# Patient Record
Sex: Male | Born: 1965
Health system: Southern US, Community
[De-identification: ages and names within clinical notes are randomized; demographics above are authoritative.]

## PROBLEM LIST (undated history)

## (undated) DIAGNOSIS — E785 Hyperlipidemia, unspecified: Secondary | ICD-10-CM

## (undated) DIAGNOSIS — E669 Obesity, unspecified: Secondary | ICD-10-CM

## (undated) DIAGNOSIS — E119 Type 2 diabetes mellitus without complications: Secondary | ICD-10-CM

## (undated) DIAGNOSIS — I10 Essential (primary) hypertension: Secondary | ICD-10-CM

## (undated) DIAGNOSIS — E559 Vitamin D deficiency, unspecified: Secondary | ICD-10-CM

## (undated) HISTORY — DX: Vitamin D deficiency, unspecified: E55.9

## (undated) HISTORY — DX: Hyperlipidemia, unspecified: E78.5

## (undated) HISTORY — DX: Essential (primary) hypertension: I10

## (undated) HISTORY — DX: Obesity, unspecified: E66.9

## (undated) HISTORY — DX: Type 2 diabetes mellitus without complications: E11.9

---
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
## 2013-01-12 ENCOUNTER — Ambulatory Visit (INDEPENDENT_AMBULATORY_CARE_PROVIDER_SITE_OTHER): Payer: 59 | Admitting: Cardiovascular Disease

## 2013-01-12 ENCOUNTER — Encounter: Payer: Self-pay | Admitting: *Deleted

## 2013-01-12 ENCOUNTER — Encounter: Payer: Self-pay | Admitting: Cardiovascular Disease

## 2013-01-12 VITALS — BP 140/98 | HR 88 | Ht 69.0 in | Wt 269.0 lb

## 2013-01-12 DIAGNOSIS — E119 Type 2 diabetes mellitus without complications: Secondary | ICD-10-CM

## 2013-01-12 DIAGNOSIS — I10 Essential (primary) hypertension: Secondary | ICD-10-CM

## 2013-01-12 DIAGNOSIS — E669 Obesity, unspecified: Secondary | ICD-10-CM | POA: Insufficient documentation

## 2013-01-12 DIAGNOSIS — E1165 Type 2 diabetes mellitus with hyperglycemia: Secondary | ICD-10-CM | POA: Insufficient documentation

## 2013-01-12 DIAGNOSIS — R011 Cardiac murmur, unspecified: Secondary | ICD-10-CM | POA: Insufficient documentation

## 2013-01-12 DIAGNOSIS — E559 Vitamin D deficiency, unspecified: Secondary | ICD-10-CM | POA: Insufficient documentation

## 2013-01-12 DIAGNOSIS — E782 Mixed hyperlipidemia: Secondary | ICD-10-CM | POA: Insufficient documentation

## 2013-01-12 DIAGNOSIS — R9431 Abnormal electrocardiogram [ECG] [EKG]: Secondary | ICD-10-CM | POA: Insufficient documentation

## 2013-01-12 DIAGNOSIS — E785 Hyperlipidemia, unspecified: Secondary | ICD-10-CM | POA: Insufficient documentation

## 2013-01-12 DIAGNOSIS — E1169 Type 2 diabetes mellitus with other specified complication: Secondary | ICD-10-CM | POA: Insufficient documentation

## 2013-01-12 MED ORDER — BENAZEPRIL HCL 40 MG PO TABS: 40.0000 mg | ORAL_TABLET | Freq: Every day | ORAL | Status: DC

## 2013-01-12 NOTE — Assessment & Plan Note (Signed)
Discussed low carb diet.  Target hemoglobin A1c is 6.5 or less.  Continue current medications.  

## 2013-01-12 NOTE — Assessment & Plan Note (Signed)
Cholesterol is at goal.  Continue current dose of statin and diet Rx.  No myalgias or side effects.  F/U  LFT's in 6 months. No results found for this basename: LDLCALC   LDL [address] [place]

## 2013-01-12 NOTE — Assessment & Plan Note (Signed)
He has a benign 1/6 SEM Doubt AS  F/U echo

## 2013-01-12 NOTE — Patient Instructions (Signed)
Your physician recommends that you schedule a follow-up appointment in: AS NEEDED  Your physician has requested that you have an echocardiogram. Echocardiography is a painless test that uses sound waves to create images of your heart. It provides your doctor with information about the size and shape of your heart and how well your heart's chambers and valves are working. This procedure takes approximately one hour. There are no restrictions for this procedure.  DX MURMUR  Your physician has recommended you make the following change in your medication: INCREASE BENAZEPRIL TO 40 MG

## 2013-01-12 NOTE — Assessment & Plan Note (Signed)
Increase ACE may need diuretic in future.  Discussed exercise weight loss and low sodium diet  F/U primary

## 2013-01-12 NOTE — Assessment & Plan Note (Signed)
Likely related to HTN  F/U echo to assess

## 2013-01-12 NOTE — Progress Notes (Signed)
Patient ID: James Murphy, male   DOB: [birth year], 47 y.o.   MRN: [id number] 47 yo self referred.  History of HTN on Rx over a year Not happy with doctors in [place] at [facility].  He is overweight and has too much salt No chest pain dyspnea or palpitations.  Compliant with meds.  Says he has history of aortic stenosis with murmur since age 86.  Denies excess ETOH or drugs.  No history of CAD.  Wife works at [employer].  Has noninsulin dependant DM    ROS: Denies fever, malais, weight loss, blurry vision, decreased visual acuity, cough, sputum, SOB, hemoptysis, pleuritic pain, palpitaitons, heartburn, abdominal pain, melena, lower extremity edema, claudication, or rash.  All other systems reviewed and negative   General: Affect appropriate Obese black male HEENT: normal Neck supple with no adenopathy JVP normal no bruits no thyromegaly Lungs clear with no wheezing and good diaphragmatic motion Heart:  S1/S2 no murmur,rub, gallop or click PMI normal Abdomen: benighn, BS positve, no tenderness, no AAA no bruit.  No HSM or HJR Distal pulses intact with no bruits No edema Neuro non-focal Skin warm and dry No muscular weakness  Medications Current Outpatient Prescriptions  Medication Sig Dispense Refill  . amLODipine (NORVASC) 5 MG tablet Take 5 mg by mouth daily.      . benazepril (LOTENSIN) 20 MG tablet Take 20 mg by mouth daily.      . metFORMIN (GLUCOPHAGE) 500 MG tablet Take 500 mg by mouth daily with breakfast.       . pravastatin (PRAVACHOL) 40 MG tablet Take 40 mg by mouth daily.      . Vitamin D, Ergocalciferol, (DRISDOL) 50000 UNITS CAPS Take 50,000 Units by mouth every 7 (seven) days.        Allergies Review of patient's allergies indicates no known allergies.  Family History: Family History  Problem Relation Age of Onset  . Diabetes    . Hypertension    . Heart disease      Social History: History   Social History  . Marital Status: Married    Spouse  Name: N/A    Number of Children: N/A  . Years of Education: N/A   Occupational History  . Not on file.   Social History Main Topics  . Smoking status: Never Smoker   . Smokeless tobacco: Not on file  . Alcohol Use: No  . Drug Use: No  . Sexually Active: Not on file   Other Topics Concern  . Not on file   Social History Narrative  . No narrative on file    Electrocardiogram:  SR rate 88 LVH with strain  Assessment and Plan

## 2013-01-25 ENCOUNTER — Ambulatory Visit (HOSPITAL_COMMUNITY): Payer: 59 | Attending: Cardiovascular Disease

## 2013-01-25 DIAGNOSIS — R011 Cardiac murmur, unspecified: Secondary | ICD-10-CM | POA: Insufficient documentation

## 2013-01-25 DIAGNOSIS — E119 Type 2 diabetes mellitus without complications: Secondary | ICD-10-CM | POA: Insufficient documentation

## 2013-01-25 DIAGNOSIS — I10 Essential (primary) hypertension: Secondary | ICD-10-CM | POA: Insufficient documentation

## 2013-01-25 DIAGNOSIS — R9431 Abnormal electrocardiogram [ECG] [EKG]: Secondary | ICD-10-CM | POA: Insufficient documentation

## 2013-01-25 NOTE — Progress Notes (Signed)
Echocardiogram performed.  

## 2013-01-28 ENCOUNTER — Ambulatory Visit (INDEPENDENT_AMBULATORY_CARE_PROVIDER_SITE_OTHER): Payer: 59 | Admitting: Family Medicine

## 2013-01-28 ENCOUNTER — Encounter: Payer: Self-pay | Admitting: Family Medicine

## 2013-01-28 VITALS — BP 140/100 | HR 84 | Resp 16 | Ht 69.0 in | Wt 268.8 lb

## 2013-01-28 DIAGNOSIS — E119 Type 2 diabetes mellitus without complications: Secondary | ICD-10-CM

## 2013-01-28 DIAGNOSIS — E559 Vitamin D deficiency, unspecified: Secondary | ICD-10-CM

## 2013-01-28 DIAGNOSIS — E669 Obesity, unspecified: Secondary | ICD-10-CM

## 2013-01-28 DIAGNOSIS — E785 Hyperlipidemia, unspecified: Secondary | ICD-10-CM

## 2013-01-28 DIAGNOSIS — R011 Cardiac murmur, unspecified: Secondary | ICD-10-CM

## 2013-01-28 DIAGNOSIS — I10 Essential (primary) hypertension: Secondary | ICD-10-CM

## 2013-01-28 LAB — CBC
Platelets: 376 10*3/uL (ref 150–400)
RDW: 14.7 % (ref 11.5–15.5)
WBC: 8.6 10*3/uL (ref 4.0–10.5)

## 2013-01-28 LAB — COMPREHENSIVE METABOLIC PANEL
ALT: 30 U/L (ref 0–53)
Albumin: 4.5 g/dL (ref 3.5–5.2)
CO2: 29 mEq/L (ref 19–32)
Calcium: 10 mg/dL (ref 8.4–10.5)
Chloride: 105 mEq/L (ref 96–112)
Creat: 1.13 mg/dL (ref 0.50–1.35)
Total Protein: 7.7 g/dL (ref 6.0–8.3)

## 2013-01-28 LAB — LIPID PANEL
Cholesterol: 111 mg/dL (ref 0–200)
HDL: 33 mg/dL — ABNORMAL LOW (ref >39)
Triglycerides: 72 mg/dL (ref <150)

## 2013-01-28 MED ORDER — AMLODIPINE BESYLATE 10 MG PO TABS: 10.0000 mg | ORAL_TABLET | Freq: Every day | ORAL | Status: DC

## 2013-01-28 NOTE — Assessment & Plan Note (Signed)
Recheck vitamin D level he has been on replacement for quite some time now

## 2013-01-28 NOTE — Assessment & Plan Note (Signed)
[place] LDL less than 100 continue pravastatin repeat fasting lipid panel today

## 2013-01-28 NOTE — Assessment & Plan Note (Signed)
Blood pressure uncontrolled however he has not taken his medication he had a recent increase in his benazepril

## 2013-01-28 NOTE — Assessment & Plan Note (Signed)
Very soft murmur heard review cardiology note he had echocardiogram which was fairly normal with exception of LVH

## 2013-01-28 NOTE — Assessment & Plan Note (Addendum)
His previous A1c looks good at 6.2% will continue metformin at current dose repeat labs today He is to schedule eye appointment

## 2013-01-28 NOTE — Assessment & Plan Note (Signed)
Discussed importance of diet exercise
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
## 2013-01-28 NOTE — Patient Instructions (Addendum)
Continue current meds Get the fasting labs we will call with results Increase activity , start exercising and watch the fried foods, soda, fruit juice I will get records  F/U 3 months

## 2013-01-29 LAB — HEMOGLOBIN A1C
Hgb A1c MFr Bld: 6.1 % — ABNORMAL HIGH (ref <5.7)
Mean Plasma Glucose: 128 mg/dL — ABNORMAL HIGH (ref <117)

## 2013-04-30 ENCOUNTER — Ambulatory Visit: Payer: 59 | Admitting: Family Medicine

## 2013-05-10 ENCOUNTER — Telehealth: Payer: Self-pay | Admitting: Family Medicine

## 2013-05-10 ENCOUNTER — Other Ambulatory Visit: Payer: Self-pay

## 2013-05-10 MED ORDER — METFORMIN HCL 500 MG PO TABS: 500.0000 mg | ORAL_TABLET | Freq: Every day | ORAL | Status: DC

## 2013-05-10 NOTE — Telephone Encounter (Signed)
Med refilled.  Not noted that patient has made request for med previously.

## 2013-05-18 ENCOUNTER — Ambulatory Visit (INDEPENDENT_AMBULATORY_CARE_PROVIDER_SITE_OTHER): Payer: 59 | Admitting: Family Medicine

## 2013-05-18 ENCOUNTER — Encounter: Payer: Self-pay | Admitting: Family Medicine

## 2013-05-18 VITALS — BP 132/90 | HR 97 | Resp 16 | Wt 265.4 lb

## 2013-05-18 DIAGNOSIS — E559 Vitamin D deficiency, unspecified: Secondary | ICD-10-CM

## 2013-05-18 DIAGNOSIS — E669 Obesity, unspecified: Secondary | ICD-10-CM

## 2013-05-18 DIAGNOSIS — E119 Type 2 diabetes mellitus without complications: Secondary | ICD-10-CM

## 2013-05-18 DIAGNOSIS — I10 Essential (primary) hypertension: Secondary | ICD-10-CM

## 2013-05-18 DIAGNOSIS — E785 Hyperlipidemia, unspecified: Secondary | ICD-10-CM

## 2013-05-18 LAB — BASIC METABOLIC PANEL
Calcium: 9.7 mg/dL (ref 8.4–10.5)
Creat: 1.19 mg/dL (ref 0.50–1.35)
Sodium: 140 mEq/L (ref 135–145)

## 2013-05-18 LAB — HEMOGLOBIN A1C
Hgb A1c MFr Bld: 6.3 % — ABNORMAL HIGH (ref <5.7)
Mean Plasma Glucose: 134 mg/dL — ABNORMAL HIGH (ref <117)

## 2013-05-18 MED ORDER — LEVOCETIRIZINE DIHYDROCHLORIDE 5 MG PO TABS: 5.0000 mg | ORAL_TABLET | Freq: Every evening | ORAL | Status: DC

## 2013-05-18 MED ORDER — AMLODIPINE BESYLATE 10 MG PO TABS: 10.0000 mg | ORAL_TABLET | Freq: Every day | ORAL | Status: DC

## 2013-05-18 MED ORDER — PRAVASTATIN SODIUM 40 MG PO TABS: 40.0000 mg | ORAL_TABLET | Freq: Every day | ORAL | Status: DC

## 2013-05-18 NOTE — Assessment & Plan Note (Signed)
BP improved today, still a little suboptimal for DM, no change to today, I think if he works on weight loss his will be able to decrease his BP meds at some point

## 2013-05-18 NOTE — Assessment & Plan Note (Signed)
Lost 3lbs ,increase aerobic activity

## 2013-05-18 NOTE — Assessment & Plan Note (Signed)
Urine Micro today Work on diet, exercise Recheck A1C,low dose metformin, we may be able to titrate off of this

## 2013-05-18 NOTE — Progress Notes (Signed)
  Subjective:    Patient ID: Conall Vangorder, male    DOB: 15-Nov-1966, 47 y.o.   MRN: [id number]  HPI  Pt here to f/u chronic medical problems,does not take fasting CBG, often forgets.No symptoms of hypoglycemia, seen by opthalmology has new glasses- [facility] Medications reviewed, has been out of his cholesterol medication past 2 months Allergies during spring, mostly runny nose,. Sneezing, has tried Claritin and zyrtec OTC, neither have helped, also using OTC eye drop which works well    Review of Systems   GEN- denies fatigue, fever, weight loss,weakness, recent illness HEENT- denies eye drainage, change in vision,+ nasal discharge, CVS- denies chest pain, palpitations RESP- denies SOB, cough, wheeze ABD- denies N/V, change in stools, abd pain GU- denies dysuria, hematuria, dribbling, incontinence MSK- denies joint pain, muscle aches, injury Neuro- denies headache, dizziness, syncope, seizure activity      Objective:   Physical Exam GEN- NAD, alert and oriented x3  Repeat BP 136/88 HEENT- PERRL, EOMI, non injected sclera, pink conjunctiva, MMM, oropharynx mild injection, TM clear bilat no effusion, no maxillary sinus tenderness, nares clear rhinorrhea Neck- Supple, no LAD CVS- RRR, soft 2/6 murmur RESP-CTAB EXT- No edema Pulses- Radial 2+, DP 2+           Assessment & Plan:

## 2013-05-18 NOTE — Patient Instructions (Addendum)
Urine sample today to check for protein Continue current medications We will call with lab results  Increase exercise 4 days a week Add multivitamin for Men ( Check make sure it has Vit D 800-1000IU) Restart the cholesterol medication  New allergy medication / you can use nasal saline F/U 4 months [employer]- for physical

## 2013-05-18 NOTE — Assessment & Plan Note (Signed)
Restart statin drug  

## 2013-05-18 NOTE — Assessment & Plan Note (Signed)
I will have him start MVI with at least 800IU vitamin D

## 2013-05-19 LAB — MICROALBUMIN / CREATININE URINE RATIO
Microalb Creat Ratio: 5.4 mg/g (ref 0.0–30.0)
Microalb, Ur: 1.87 mg/dL (ref 0.00–1.89)

## 2013-08-30 ENCOUNTER — Telehealth: Payer: Self-pay | Admitting: Family Medicine

## 2013-08-30 NOTE — Telephone Encounter (Signed)
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 They told him that his proteins were still high. Do you want to call him in anything? He uses [facility] (for some reason it will not let me pull that up in visit pharmacy)

## 2013-08-30 NOTE — Telephone Encounter (Signed)
Left message for pt to call back  °

## 2013-08-30 NOTE — Telephone Encounter (Signed)
Pt needs appt to have urine repeated There is not medicine for this he should continue his current blood pressure medications

## 2013-08-31 NOTE — Telephone Encounter (Signed)
Pt made an appt for Monday at 8:30.

## 2013-09-06 ENCOUNTER — Encounter: Payer: Self-pay | Admitting: Family Medicine

## 2013-09-06 ENCOUNTER — Ambulatory Visit (INDEPENDENT_AMBULATORY_CARE_PROVIDER_SITE_OTHER): Payer: 59 | Admitting: Family Medicine

## 2013-09-06 VITALS — BP 150/94 | HR 80 | Temp 97.8°F | Resp 18 | Wt 267.0 lb

## 2013-09-06 DIAGNOSIS — Z23 Encounter for immunization: Secondary | ICD-10-CM

## 2013-09-06 DIAGNOSIS — I10 Essential (primary) hypertension: Secondary | ICD-10-CM

## 2013-09-06 DIAGNOSIS — R809 Proteinuria, unspecified: Secondary | ICD-10-CM

## 2013-09-06 DIAGNOSIS — E785 Hyperlipidemia, unspecified: Secondary | ICD-10-CM

## 2013-09-06 DIAGNOSIS — E119 Type 2 diabetes mellitus without complications: Secondary | ICD-10-CM

## 2013-09-06 LAB — COMPLETE METABOLIC PANEL WITH GFR
ALT: 28 U/L (ref 0–53)
BUN: 15 mg/dL (ref 6–23)
CO2: 29 mEq/L (ref 19–32)
Calcium: 10.4 mg/dL (ref 8.4–10.5)
Creat: 1.19 mg/dL (ref 0.50–1.35)
GFR, Est African American: 85 mL/min
Total Bilirubin: 0.4 mg/dL (ref 0.3–1.2)

## 2013-09-06 LAB — URINALYSIS, ROUTINE W REFLEX MICROSCOPIC
Glucose, UA: NEGATIVE mg/dL
Hgb urine dipstick: NEGATIVE
Ketones, ur: NEGATIVE mg/dL
Leukocytes, UA: NEGATIVE
Nitrite: NEGATIVE
Protein, ur: NEGATIVE mg/dL

## 2013-09-06 LAB — CBC WITH DIFFERENTIAL/PLATELET
Eosinophils Absolute: 0.2 10*3/uL (ref 0.0–0.7)
Hemoglobin: 14.3 g/dL (ref 13.0–17.0)
Lymphocytes Relative: 64 % — ABNORMAL HIGH (ref 12–46)
Lymphs Abs: 4 10*3/uL (ref 0.7–4.0)
MCH: 28.9 pg (ref 26.0–34.0)
Monocytes Relative: 10 % (ref 3–12)
Neutro Abs: 1.4 10*3/uL — ABNORMAL LOW (ref 1.7–7.7)
Neutrophils Relative %: 21 % — ABNORMAL LOW (ref 43–77)
Platelets: 349 10*3/uL (ref 150–400)
RBC: 4.95 MIL/uL (ref 4.22–5.81)
WBC: 6.2 10*3/uL (ref 4.0–10.5)

## 2013-09-06 LAB — LIPID PANEL
LDL Cholesterol: 74 mg/dL (ref 0–99)
Total CHOL/HDL Ratio: 3.5 Ratio
VLDL: 14 mg/dL (ref 0–40)

## 2013-09-06 MED ORDER — AMLODIPINE BESYLATE 10 MG PO TABS: 10.0000 mg | ORAL_TABLET | Freq: Every day | ORAL | Status: DC

## 2013-09-06 MED ORDER — PRAVASTATIN SODIUM 40 MG PO TABS: 40.0000 mg | ORAL_TABLET | Freq: Every day | ORAL | Status: DC

## 2013-09-06 MED ORDER — BENAZEPRIL HCL 40 MG PO TABS: 40.0000 mg | ORAL_TABLET | Freq: Every day | ORAL | Status: DC

## 2013-09-06 MED ORDER — HYDROCHLOROTHIAZIDE 25 MG PO TABS: 25.0000 mg | ORAL_TABLET | Freq: Every day | ORAL | Status: DC

## 2013-09-06 MED ORDER — METFORMIN HCL 500 MG PO TABS: 500.0000 mg | ORAL_TABLET | Freq: Every day | ORAL | Status: DC

## 2013-09-06 NOTE — Progress Notes (Signed)
  Subjective:    Patient ID: James Murphy, male    DOB: Dec 19, 1966, 47 y.o.   MRN: [id number]  HPI  Pt here to f/u proteinuria. Had DOT exam, 30 of protein seen in UA, BP also elevated, so advised to come to PCP for evaluation. Taking meds as prescribed. Has not been watching his diet. BP at DOT exam was 145/95 Medications reviewed   Review of Systems  GEN- denies fatigue, fever, weight loss,weakness, recent illness HEENT- denies eye drainage, change in vision, nasal discharge, CVS- denies chest pain, palpitations RESP- denies SOB, cough, wheeze ABD- denies N/V, change in stools, abd pain GU- denies dysuria, hematuria, dribbling, incontinence MSK- denies joint pain, muscle aches, injury Neuro- denies headache, dizziness, syncope, seizure activity      Objective:   Physical Exam GEN- NAD, alert and oriented x3 HEENT- PERRL, EOMI, non injected sclera, pink conjunctiva, MMM, oropharynx clear Neck- Supple,  CVS- RRR, no murmur RESP-CTAB ABD-NABS,soft,NT,ND EXT- No edema Pulses- Radial, DP- 2+        Assessment & Plan:

## 2013-09-06 NOTE — Assessment & Plan Note (Signed)
Repeat UA, negative for protein, urine Micro 3 months ago, normal Will await renal function, may need renal ultrasound

## 2013-09-06 NOTE — Assessment & Plan Note (Signed)
Uncontrolled, add HCTZ to regimen Check metabolic panel

## 2013-09-06 NOTE — Assessment & Plan Note (Signed)
Check FLP, on statin drug 

## 2013-09-06 NOTE — Assessment & Plan Note (Signed)
Check A1C 

## 2013-09-06 NOTE — Patient Instructions (Signed)
New blood pressure medication HCTZ this is a water pill Continue all other medications We will call with lab results  F/U 2 weeks for blood pressure

## 2013-09-20 ENCOUNTER — Ambulatory Visit (INDEPENDENT_AMBULATORY_CARE_PROVIDER_SITE_OTHER): Payer: 59 | Admitting: Family Medicine

## 2013-09-20 ENCOUNTER — Encounter: Payer: Self-pay | Admitting: Family Medicine

## 2013-09-20 VITALS — BP 128/92 | HR 80 | Temp 98.0°F | Resp 20 | Wt 267.0 lb

## 2013-09-20 DIAGNOSIS — Z23 Encounter for immunization: Secondary | ICD-10-CM

## 2013-09-20 DIAGNOSIS — I10 Essential (primary) hypertension: Secondary | ICD-10-CM

## 2013-09-20 NOTE — Patient Instructions (Signed)
Continue current medication F/U 4 months  

## 2013-09-20 NOTE — Progress Notes (Signed)
  Subjective:    Patient ID: James Murphy, male    DOB: September 12, 1966, 47 y.o.   MRN: [id number]  HPI  Pt here to f/u HTN, last visit 2 weeks ago started on HCTZ. 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 Labs reviewed WNL, UA neg for protein.   He does state that he snores a lot, denies stopping breathing/apnea. Has tried OTC meds. Did not want further evaluation  Review of Systems  GEN- denies fatigue, fever, weight loss,weakness, recent illness HEENT- denies eye drainage, change in vision, nasal discharge, CVS- denies chest pain, palpitations RESP- denies SOB, cough, wheeze Neuro- denies headache, dizziness, syncope, seizure activity      Objective:   Physical Exam  GEN- NAD, alert and oriented x3 HEENT- PERRL, EOMI, non injected sclera, pink conjunctiva, MMM, oropharynx clear Neck- circumference 19cm CVS- RRR, no murmur RESP-CTAB EXT- No edema Pulses- Radial 2+       Assessment & Plan:

## 2013-09-20 NOTE — Assessment & Plan Note (Signed)
BP improved, continue current meds Labs reviewed, UA was neg for protein

## 2013-12-10 ENCOUNTER — Other Ambulatory Visit: Payer: Self-pay | Admitting: Family Medicine

## 2014-01-17 ENCOUNTER — Emergency Department (INDEPENDENT_AMBULATORY_CARE_PROVIDER_SITE_OTHER): Payer: 59

## 2014-01-17 ENCOUNTER — Encounter (HOSPITAL_COMMUNITY): Payer: Self-pay | Admitting: Emergency Medicine

## 2014-01-17 ENCOUNTER — Emergency Department (HOSPITAL_COMMUNITY): Payer: 59

## 2014-01-17 ENCOUNTER — Emergency Department (HOSPITAL_COMMUNITY)
Admission: EM | Admit: 2014-01-17 | Discharge: 2014-01-17 | Disposition: A | Discharge status: Home / Self Care | Payer: 59 | Source: Home / Self Care | Attending: Emergency Medicine | Admitting: Emergency Medicine

## 2014-01-17 DIAGNOSIS — J069 Acute upper respiratory infection, unspecified: Secondary | ICD-10-CM

## 2014-01-17 DIAGNOSIS — B9789 Other viral agents as the cause of diseases classified elsewhere: Principal | ICD-10-CM

## 2014-01-17 MED ORDER — FLUTICASONE PROPIONATE 50 MCG/ACT NA SUSP: 2.0000 | Freq: Two times a day (BID) | NASAL | Status: DC

## 2014-01-17 MED ORDER — AZITHROMYCIN 250 MG PO TABS: ORAL_TABLET | ORAL | Status: DC

## 2014-01-17 MED ORDER — AEROCHAMBER PLUS FLO-VU LARGE MISC: 1.0000 | Freq: Once | Status: DC

## 2014-01-17 MED ORDER — IPRATROPIUM-ALBUTEROL 0.5-2.5 (3) MG/3ML IN SOLN
RESPIRATORY_TRACT | Status: AC
  Filled 2014-01-17: qty 3

## 2014-01-17 MED ORDER — METHYLPREDNISOLONE 4 MG PO KIT: PACK | ORAL | Status: DC

## 2014-01-17 MED ORDER — ALBUTEROL SULFATE HFA 108 (90 BASE) MCG/ACT IN AERS: 1.0000 | INHALATION_SPRAY | Freq: Four times a day (QID) | RESPIRATORY_TRACT | Status: DC | PRN

## 2014-01-17 MED ORDER — IPRATROPIUM-ALBUTEROL 0.5-2.5 (3) MG/3ML IN SOLN
3.0000 mL | Freq: Once | RESPIRATORY_TRACT | Status: AC
  Administered 2014-01-17: 3 mL via RESPIRATORY_TRACT

## 2014-01-17 NOTE — ED Notes (Signed)
C/o   Generalized body aches.  Chills.  Sinus pressure and pain.  Nonproductive cough.  symptoms present x 4 days.   Denies n/v/d.   No relief with otc meds.

## 2014-01-17 NOTE — ED Provider Notes (Signed)
Medical screening examination/treatment/procedure(s) were performed by non-physician practitioner and as supervising physician I was immediately available for consultation/collaboration.  Leslee Homeavid Deavon Podgorski, M.D.   Reuben Likesavid C Araina Butrick, MD 01/17/14 [phone]

## 2014-01-17 NOTE — Discharge Instructions (Signed)
Antibiotic Nonuse ° Your caregiver felt that the infection or problem was not one that would be helped with an antibiotic. °Infections may be caused by viruses or bacteria. Only a caregiver can tell which one of these is the likely cause of an illness. A cold is the most common cause of infection in both adults and children. A cold is a virus. Antibiotic treatment will have no effect on a viral infection. Viruses can lead to many lost days of work caring for sick children and many missed days of school. Children may catch as many as 10 "colds" or "flus" per year during which they can be tearful, cranky, and uncomfortable. The goal of treating a virus is aimed at keeping the ill person comfortable. °Antibiotics are medications used to help the body fight bacterial infections. There are relatively few types of bacteria that cause infections but there are hundreds of viruses. While both viruses and bacteria cause infection they are very different types of germs. A viral infection will typically go away by itself within 7 to 10 days. Bacterial infections may spread or get worse without antibiotic treatment. °Examples of bacterial infections are: °· Sore throats (like strep throat or tonsillitis). °· Infection in the lung (pneumonia). °· Ear and skin infections. °Examples of viral infections are: °· Colds or flus. °· Most coughs and bronchitis. °· Sore throats not caused by Strep. °· Runny noses. °It is often best not to take an antibiotic when a viral infection is the cause of the problem. Antibiotics can kill off the helpful bacteria that we have inside our body and allow harmful bacteria to start growing. Antibiotics can cause side effects such as allergies, nausea, and diarrhea without helping to improve the symptoms of the viral infection. Additionally, repeated uses of antibiotics can cause bacteria inside of our body to become resistant. That resistance can be passed onto harmful bacterial. The next time you have  an infection it may be harder to treat if antibiotics are used when they are not needed. Not treating with antibiotics allows our own immune system to develop and take care of infections more efficiently. Also, antibiotics will work better for us when they are prescribed for bacterial infections. °Treatments for a child that is ill may include: °· Give extra fluids throughout the day to stay hydrated. °· Get plenty of rest. °· Only give your child over-the-counter or prescription medicines for pain, discomfort, or fever as directed by your caregiver. °· The use of a cool mist humidifier may help stuffy noses. °· Cold medications if suggested by your caregiver. °Your caregiver may decide to start you on an antibiotic if: °· The problem you were seen for today continues for a longer length of time than expected. °· You develop a secondary bacterial infection. °SEEK MEDICAL CARE IF: °· Fever lasts longer than 5 days. °· Symptoms continue to get worse after 5 to 7 days or become severe. °· Difficulty in breathing develops. °· Signs of dehydration develop (poor drinking, rare urinating, dark colored urine). °· Changes in behavior or worsening tiredness (listlessness or lethargy). °Document Released: 02/17/2002 Document Revised: 03/02/2012 Document Reviewed: 08/16/2009 °ExitCare® Patient Information ©2014 [employer]. ° °Upper Respiratory Infection, Adult °An upper respiratory infection (URI) is also sometimes known as the common cold. The upper respiratory tract includes the nose, sinuses, throat, trachea, and bronchi. Bronchi are the airways leading to the lungs. Most people improve within 1 week, but symptoms can last up to 2 weeks. A residual cough may   last even longer.  °CAUSES °Many different viruses can infect the tissues lining the upper respiratory tract. The tissues become irritated and inflamed and often become very moist. Mucus production is also common. A cold is contagious. You can easily spread the virus  to others by oral contact. This includes kissing, sharing a glass, coughing, or sneezing. Touching your mouth or nose and then touching a surface, which is then touched by another person, can also spread the virus. °SYMPTOMS  °Symptoms typically develop 1 to 3 days after you come in contact with a cold virus. Symptoms vary from person to person. They may include: °· Runny nose. °· Sneezing. °· Nasal congestion. °· Sinus irritation. °· Sore throat. °· Loss of voice (laryngitis). °· Cough. °· Fatigue. °· Muscle aches. °· Loss of appetite. °· Headache. °· Low-grade fever. °DIAGNOSIS  °You might diagnose your own cold based on familiar symptoms, since most people get a cold 2 to 3 times a year. Your caregiver can confirm this based on your exam. Most importantly, your caregiver can check that your symptoms are not due to another disease such as strep throat, sinusitis, pneumonia, asthma, or epiglottitis. Blood tests, throat tests, and X-rays are not necessary to diagnose a common cold, but they may sometimes be helpful in excluding other more serious diseases. Your caregiver will decide if any further tests are required. °RISKS AND COMPLICATIONS  °You may be at risk for a more severe case of the common cold if you smoke cigarettes, have chronic heart disease (such as heart failure) or lung disease (such as asthma), or if you have a weakened immune system. The very young and very old are also at risk for more serious infections. Bacterial sinusitis, middle ear infections, and bacterial pneumonia can complicate the common cold. The common cold can worsen asthma and chronic obstructive pulmonary disease (COPD). Sometimes, these complications can require emergency medical care and may be life-threatening. °PREVENTION  °The best way to protect against getting a cold is to practice good hygiene. Avoid oral or hand contact with people with cold symptoms. Wash your hands often if contact occurs. There is no clear evidence that  vitamin C, vitamin E, echinacea, or exercise reduces the chance of developing a cold. However, it is always recommended to get plenty of rest and practice good nutrition. °TREATMENT  °Treatment is directed at relieving symptoms. There is no cure. Antibiotics are not effective, because the infection is caused by a virus, not by bacteria. Treatment may include: °· Increased fluid intake. Sports drinks offer valuable electrolytes, sugars, and fluids. °· Breathing heated mist or steam (vaporizer or shower). °· Eating chicken soup or other clear broths, and maintaining good nutrition. °· Getting plenty of rest. °· Using gargles or lozenges for comfort. °· Controlling fevers with ibuprofen or acetaminophen as directed by your caregiver. °· Increasing usage of your inhaler if you have asthma. °Zinc gel and zinc lozenges, taken in the first 24 hours of the common cold, can shorten the duration and lessen the severity of symptoms. Pain medicines may help with fever, muscle aches, and throat pain. A variety of non-prescription medicines are available to treat congestion and runny nose. Your caregiver can make recommendations and may suggest nasal or lung inhalers for other symptoms.  °HOME CARE INSTRUCTIONS  °· Only take over-the-counter or prescription medicines for pain, discomfort, or fever as directed by your caregiver. °· Use a warm mist humidifier or inhale steam from a shower to increase air moisture. This may keep   secretions moist and make it easier to breathe. °· Drink enough water and fluids to keep your urine clear or pale yellow. °· Rest as needed. °· Return to work when your temperature has returned to normal or as your caregiver advises. You may need to stay home longer to avoid infecting others. You can also use a face mask and careful hand washing to prevent spread of the virus. °SEEK MEDICAL CARE IF:  °· After the first few days, you feel you are getting worse rather than better. °· You need your caregiver's  advice about medicines to control symptoms. °· You develop chills, worsening shortness of breath, or brown or red sputum. These may be signs of pneumonia. °· You develop yellow or brown nasal discharge or pain in the face, especially when you bend forward. These may be signs of sinusitis. °· You develop a fever, swollen neck glands, pain with swallowing, or white areas in the back of your throat. These may be signs of strep throat. °SEEK IMMEDIATE MEDICAL CARE IF:  °· You have a fever. °· You develop severe or persistent headache, ear pain, sinus pain, or chest pain. °· You develop wheezing, a prolonged cough, cough up blood, or have a change in your usual mucus (if you have chronic lung disease). °· You develop sore muscles or a stiff neck. °Document Released: 06/04/2001 Document Revised: 03/02/2012 Document Reviewed: 04/12/2011 °ExitCare® Patient Information ©2014 [employer]. ° °

## 2014-01-17 NOTE — ED Notes (Signed)
Waiting for patient to finish breathing treatment. Will do x-ray after treatment.
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
## 2014-01-21 ENCOUNTER — Ambulatory Visit: Payer: 59 | Admitting: Family Medicine

## 2014-04-14 ENCOUNTER — Other Ambulatory Visit: Payer: Self-pay | Admitting: Family Medicine

## 2014-04-14 NOTE — Telephone Encounter (Signed)
Medication filled x1 with no refills.   Requires office visit before any further refills can be given.  

## 2014-04-15 ENCOUNTER — Encounter: Payer: Self-pay | Admitting: Family Medicine

## 2014-04-15 ENCOUNTER — Ambulatory Visit (INDEPENDENT_AMBULATORY_CARE_PROVIDER_SITE_OTHER): Payer: 59 | Admitting: Family Medicine

## 2014-04-15 VITALS — BP 142/84 | HR 86 | Temp 98.5°F | Resp 16 | Ht 69.0 in | Wt 250.0 lb

## 2014-04-15 DIAGNOSIS — E119 Type 2 diabetes mellitus without complications: Secondary | ICD-10-CM

## 2014-04-15 DIAGNOSIS — E669 Obesity, unspecified: Secondary | ICD-10-CM

## 2014-04-15 DIAGNOSIS — I10 Essential (primary) hypertension: Secondary | ICD-10-CM

## 2014-04-15 DIAGNOSIS — Z23 Encounter for immunization: Secondary | ICD-10-CM

## 2014-04-15 DIAGNOSIS — E785 Hyperlipidemia, unspecified: Secondary | ICD-10-CM

## 2014-04-15 LAB — LIPID PANEL
Cholesterol: 137 mg/dL (ref 0–200)
HDL: 45 mg/dL (ref >39)
LDL CALC: 84 mg/dL (ref 0–99)
TRIGLYCERIDES: 39 mg/dL (ref <150)
Total CHOL/HDL Ratio: 3 Ratio
VLDL: 8 mg/dL (ref 0–40)

## 2014-04-15 LAB — CBC WITH DIFFERENTIAL/PLATELET
Basophils Absolute: 0.1 10*3/uL (ref 0.0–0.1)
Basophils Relative: 1 % (ref 0–1)
Eosinophils Absolute: 0.2 10*3/uL (ref 0.0–0.7)
Eosinophils Relative: 3 % (ref 0–5)
HEMATOCRIT: 41.8 % (ref 39.0–52.0)
HEMOGLOBIN: 14.6 g/dL (ref 13.0–17.0)
Lymphocytes Relative: 58 % — ABNORMAL HIGH (ref 12–46)
Lymphs Abs: 3.9 10*3/uL (ref 0.7–4.0)
MCH: 29.3 pg (ref 26.0–34.0)
MCHC: 34.9 g/dL (ref 30.0–36.0)
MCV: 83.8 fL (ref 78.0–100.0)
MONO ABS: 0.6 10*3/uL (ref 0.1–1.0)
MONOS PCT: 9 % (ref 3–12)
NEUTROS ABS: 1.9 10*3/uL (ref 1.7–7.7)
Neutrophils Relative %: 29 % — ABNORMAL LOW (ref 43–77)
Platelets: 374 10*3/uL (ref 150–400)
RBC: 4.99 MIL/uL (ref 4.22–5.81)
RDW: 14.3 % (ref 11.5–15.5)
WBC: 6.7 10*3/uL (ref 4.0–10.5)

## 2014-04-15 LAB — COMPREHENSIVE METABOLIC PANEL
ALT: 16 U/L (ref 0–53)
AST: 15 U/L (ref 0–37)
Albumin: 4.3 g/dL (ref 3.5–5.2)
Alkaline Phosphatase: 41 U/L (ref 39–117)
BUN: 21 mg/dL (ref 6–23)
CALCIUM: 9.7 mg/dL (ref 8.4–10.5)
CHLORIDE: 101 meq/L (ref 96–112)
CO2: 28 mEq/L (ref 19–32)
Creat: 1.28 mg/dL (ref 0.50–1.35)
Glucose, Bld: 93 mg/dL (ref 70–99)
Potassium: 4.1 mEq/L (ref 3.5–5.3)
SODIUM: 138 meq/L (ref 135–145)
TOTAL PROTEIN: 7.7 g/dL (ref 6.0–8.3)
Total Bilirubin: 0.5 mg/dL (ref 0.2–1.2)

## 2014-04-15 LAB — HEMOGLOBIN A1C, FINGERSTICK: Hgb A1C (fingerstick): 5.9 % — ABNORMAL HIGH (ref <5.7)

## 2014-04-15 MED ORDER — PRAVASTATIN SODIUM 40 MG PO TABS: ORAL_TABLET | ORAL | Status: DC

## 2014-04-15 MED ORDER — BENAZEPRIL HCL 40 MG PO TABS: 40.0000 mg | ORAL_TABLET | Freq: Every day | ORAL | Status: DC

## 2014-04-15 MED ORDER — AMLODIPINE BESYLATE 10 MG PO TABS: 10.0000 mg | ORAL_TABLET | Freq: Every day | ORAL | Status: DC

## 2014-04-15 MED ORDER — HYDROCHLOROTHIAZIDE 25 MG PO TABS: 25.0000 mg | ORAL_TABLET | Freq: Every day | ORAL | Status: DC

## 2014-04-15 NOTE — Progress Notes (Signed)
Patient ID: James Murphy, male   DOB: Dec 20, 1966, 48 y.o.   MRN: [id number]   Subjective:    Patient ID: James Murphy, male    DOB: Dec 20, 1966, 48 y.o.   MRN: [id number]  Patient presents for Medication refills  here follow chronic medical problems. He missed his last visit for his diabetes and hypertension. He's been out of his medications for the past 3 days. He has lost 17 pounds since her last visit intentionally he currently works 2 jobs and his second is very physical. He has no specific concerns today. He was seen by orthopedics since her last visit secondary to left knee pain and had a steroid injection this needs he also had right ankle pain and they gave him a brace and what sounds like some anti-inflammatories. He is followup with him in 3 weeks. He is due for tetanus booster. He has not been checking his blood sugars therefore I have no readings.    Review Of Systems:  GEN- denies fatigue, fever, weight loss,weakness, recent illness HEENT- denies eye drainage, change in vision, nasal discharge, CVS- denies chest pain, palpitations RESP- denies SOB, cough, wheeze ABD- denies N/V, change in stools, abd pain GU- denies dysuria, hematuria, dribbling, incontinence MSK- + joint pain, muscle aches, injury Neuro- denies headache, dizziness, syncope, seizure activity       Objective:    BP 142/84  Pulse 86  Temp(Src) 98.5 F (36.9 C) (Oral)  Resp 16  Ht 5\' 9"  (1.753 m)  Wt 250 lb (113.399 kg)  BMI 36.90 kg/m2 GEN- NAD, alert and oriented x3 HEENT- PERRL, EOMI, non injected sclera, pink conjunctiva, MMM, oropharynx clear Neck- Supple,  CVS- RRR, no murmur RESP-CTAB ABD-NABS,soft,NT,ND EXT- No edema Pulses- Radial, DP- 2+        Assessment & Plan:      Problem List Items Addressed This Visit   Obesity   Relevant Orders      Tdap vaccine greater than or equal to 7yo IM (Completed)   Hypertension - Primary     Blood pressure elevated today however he does not have  his medication in the past 3 days    Relevant Medications      amLODIpine (NORVASC) tablet      benazepril (LOTENSIN) tablet      hydrochlorothiazide tablet      pravastatin (PRAVACHOL) tablet   Other Relevant Orders      CBC with Differential      Tdap vaccine greater than or equal to 7yo IM (Completed)   Hyperlipidemia     Check lipids continue pravastatin    Relevant Medications      amLODIpine (NORVASC) tablet      benazepril (LOTENSIN) tablet      hydrochlorothiazide tablet      pravastatin (PRAVACHOL) tablet   Other Relevant Orders      Lipid panel      Tdap vaccine greater than or equal to 7yo IM (Completed)   Diabetes     He's had significant weight loss intentionally. I'll recheck his A1c he will likely be able to come off of the metformin    Relevant Medications      benazepril (LOTENSIN) tablet      pravastatin (PRAVACHOL) tablet   Other Relevant Orders      Comprehensive metabolic panel      Hemoglobin A1C, fingerstick      Tdap vaccine greater than or equal to 7yo IM (Completed)    Other Visit Diagnoses   Need  for prophylactic vaccination with combined diphtheria-tetanus-pertussis (DTP) vaccine        Relevant Orders       Tdap vaccine greater than or equal to 7yo IM (Completed)       Note: This dictation was prepared with Dragon dictation along with smaller phrase technology. Any transcriptional errors that result from this process are unintentional.

## 2014-04-15 NOTE — Assessment & Plan Note (Signed)
He's had significant weight loss intentionally. I'll recheck his A1c he will likely be able to come off of the metformin

## 2014-04-15 NOTE — Assessment & Plan Note (Signed)
Check lipids continue pravastatin 

## 2014-04-15 NOTE — Assessment & Plan Note (Signed)
Weight loss noted. He was congratulated on this. Continue to work on diet

## 2014-04-15 NOTE — Patient Instructions (Signed)
Continue current medications We will call with lab results  TETANUS BOOSTER GIVEN F/U 4 months

## 2014-04-15 NOTE — Assessment & Plan Note (Signed)
Blood pressure elevated today however he does not have his medication in the past 3 days

## 2014-08-10 ENCOUNTER — Encounter: Payer: Self-pay | Admitting: Family Medicine

## 2014-08-10 ENCOUNTER — Ambulatory Visit (INDEPENDENT_AMBULATORY_CARE_PROVIDER_SITE_OTHER): Payer: 59 | Admitting: Family Medicine

## 2014-08-10 VITALS — BP 124/76 | HR 72 | Temp 98.4°F | Resp 14 | Ht 69.0 in | Wt 245.0 lb

## 2014-08-10 DIAGNOSIS — E785 Hyperlipidemia, unspecified: Secondary | ICD-10-CM

## 2014-08-10 DIAGNOSIS — I10 Essential (primary) hypertension: Secondary | ICD-10-CM

## 2014-08-10 DIAGNOSIS — E119 Type 2 diabetes mellitus without complications: Secondary | ICD-10-CM

## 2014-08-10 DIAGNOSIS — E669 Obesity, unspecified: Secondary | ICD-10-CM

## 2014-08-10 LAB — COMPREHENSIVE METABOLIC PANEL
ALBUMIN: 4.5 g/dL (ref 3.5–5.2)
ALT: 20 U/L (ref 0–53)
AST: 18 U/L (ref 0–37)
Alkaline Phosphatase: 39 U/L (ref 39–117)
BUN: 26 mg/dL — AB (ref 6–23)
CALCIUM: 9.4 mg/dL (ref 8.4–10.5)
CO2: 24 meq/L (ref 19–32)
Chloride: 101 mEq/L (ref 96–112)
Creat: 1.29 mg/dL (ref 0.50–1.35)
Glucose, Bld: 97 mg/dL (ref 70–99)
POTASSIUM: 4 meq/L (ref 3.5–5.3)
SODIUM: 138 meq/L (ref 135–145)
TOTAL PROTEIN: 7.7 g/dL (ref 6.0–8.3)
Total Bilirubin: 0.3 mg/dL (ref 0.2–1.2)

## 2014-08-10 LAB — CBC WITH DIFFERENTIAL/PLATELET
Basophils Absolute: 0.1 10*3/uL (ref 0.0–0.1)
Basophils Relative: 1 % (ref 0–1)
Eosinophils Absolute: 0.2 10*3/uL (ref 0.0–0.7)
Eosinophils Relative: 3 % (ref 0–5)
HEMATOCRIT: 40.8 % (ref 39.0–52.0)
HEMOGLOBIN: 13.7 g/dL (ref 13.0–17.0)
LYMPHS PCT: 63 % — AB (ref 12–46)
Lymphs Abs: 4.5 10*3/uL — ABNORMAL HIGH (ref 0.7–4.0)
MCH: 28.6 pg (ref 26.0–34.0)
MCHC: 33.6 g/dL (ref 30.0–36.0)
MCV: 85.2 fL (ref 78.0–100.0)
MONOS PCT: 8 % (ref 3–12)
Monocytes Absolute: 0.6 10*3/uL (ref 0.1–1.0)
NEUTROS ABS: 1.8 10*3/uL (ref 1.7–7.7)
NEUTROS PCT: 25 % — AB (ref 43–77)
Platelets: 353 10*3/uL (ref 150–400)
RBC: 4.79 MIL/uL (ref 4.22–5.81)
RDW: 14.3 % (ref 11.5–15.5)
WBC: 7.2 10*3/uL (ref 4.0–10.5)

## 2014-08-10 LAB — HEMOGLOBIN A1C
Hgb A1c MFr Bld: 6.5 % — ABNORMAL HIGH (ref <5.7)
MEAN PLASMA GLUCOSE: 140 mg/dL — AB (ref <117)

## 2014-08-10 LAB — LIPID PANEL
CHOL/HDL RATIO: 2.9 ratio
CHOLESTEROL: 119 mg/dL (ref 0–200)
HDL: 41 mg/dL (ref >39)
LDL Cholesterol: 66 mg/dL (ref 0–99)
Triglycerides: 61 mg/dL (ref <150)
VLDL: 12 mg/dL (ref 0–40)

## 2014-08-10 NOTE — Assessment & Plan Note (Signed)
Diet control I will check another A1c today to make sure he is maintaining his A1c less than 7%

## 2014-08-10 NOTE — Assessment & Plan Note (Signed)
Continues to lose weight intentionally, think her exercise routine and dietary habit

## 2014-08-10 NOTE — Patient Instructions (Signed)
I will call with lab results Continue current medications F/u 4 months for physical

## 2014-08-10 NOTE — Assessment & Plan Note (Signed)
Blood pressure is well-controlled we'll continue current medications will obtain fasting labs today

## 2014-08-10 NOTE — Assessment & Plan Note (Signed)
I will recheck his LDL his cholesterol at there he did at her last visit if he is still maintaining a low LDL I will cut his pravastatin to down to 20 mg

## 2014-08-10 NOTE — Progress Notes (Signed)
Patient ID: James Murphy, male   DOB: 24-Jun-1966, 48 y.o.   MRN: [id number]   Subjective:    Patient ID: James AxonHenry Wojdyla, male    DOB: 24-Jun-1966, 48 y.o.   MRN: [id number]  Patient presents for 4 month F/U  patient here to followup chronic medical problems he has no specific concerns today. He is diet controlled diabetes mellitus he continues to lose weight intentionally to help his overall health. He is down another 5 pounds. He's taken his blood pressure medication cholesterol medicine as prescribed. When he did take his blood sugars he states that they were normal in the low 100s. He also takes his blood pressure at home and it runs in the 120s over 70s at home.  He has been seen by ophthalmology was given prescription glasses this was done in [place]  Review Of Systems:  GEN- denies fatigue, fever, weight loss,weakness, recent illness HEENT- denies eye drainage, change in vision, nasal discharge, CVS- denies chest pain, palpitations RESP- denies SOB, cough, wheeze ABD- denies N/V, change in stools, abd pain GU- denies dysuria, hematuria, dribbling, incontinence MSK- denies joint pain, muscle aches, injury Neuro- denies headache, dizziness, syncope, seizure activity       Objective:    BP 124/76  Pulse 72  Temp(Src) 98.4 F (36.9 C) (Oral)  Resp 14  Ht 5\' 9"  (1.753 m)  Wt 245 lb (111.131 kg)  BMI 36.16 kg/m2 GEN- NAD, alert and oriented x3 HEENT- PERRL, EOMI, non injected sclera, pink conjunctiva, MMM, oropharynx clear CVS- RRR, 2/6 SEM  RESP-CTAB EXT- No edema Pulses- Radial 2+        Assessment & Plan:      Problem List Items Addressed This Visit   Hypertension - Primary   Relevant Orders      CBC with Differential      Comprehensive metabolic panel   Diabetes   Relevant Orders      Hemoglobin A1c      Lipid panel      Microalbumin / creatinine urine ratio      Note: This dictation was prepared with Dragon dictation along with smaller phrase  technology. Any transcriptional errors that result from this process are unintentional.

## 2014-08-11 LAB — MICROALBUMIN / CREATININE URINE RATIO
CREATININE, URINE: 162.2 mg/dL
MICROALB UR: 0.72 mg/dL (ref 0.00–1.89)
MICROALB/CREAT RATIO: 4.4 mg/g (ref 0.0–30.0)

## 2014-09-27 ENCOUNTER — Other Ambulatory Visit: Payer: Self-pay | Admitting: Family Medicine

## 2014-09-27 NOTE — Telephone Encounter (Signed)
Medication refilled per protocol. 

## 2014-12-12 ENCOUNTER — Ambulatory Visit (INDEPENDENT_AMBULATORY_CARE_PROVIDER_SITE_OTHER): Payer: 59 | Admitting: Family Medicine

## 2014-12-12 ENCOUNTER — Encounter: Payer: Self-pay | Admitting: Family Medicine

## 2014-12-12 VITALS — BP 138/76 | HR 76 | Temp 98.8°F | Resp 14 | Ht 69.0 in | Wt 257.0 lb

## 2014-12-12 DIAGNOSIS — E119 Type 2 diabetes mellitus without complications: Secondary | ICD-10-CM

## 2014-12-12 DIAGNOSIS — I10 Essential (primary) hypertension: Secondary | ICD-10-CM

## 2014-12-12 DIAGNOSIS — Z0000 Encounter for general adult medical examination without abnormal findings: Secondary | ICD-10-CM

## 2014-12-12 DIAGNOSIS — M79673 Pain in unspecified foot: Secondary | ICD-10-CM | POA: Insufficient documentation

## 2014-12-12 DIAGNOSIS — M79672 Pain in left foot: Secondary | ICD-10-CM

## 2014-12-12 DIAGNOSIS — E669 Obesity, unspecified: Secondary | ICD-10-CM

## 2014-12-12 DIAGNOSIS — E785 Hyperlipidemia, unspecified: Secondary | ICD-10-CM

## 2014-12-12 DIAGNOSIS — Z23 Encounter for immunization: Secondary | ICD-10-CM

## 2014-12-12 LAB — COMPREHENSIVE METABOLIC PANEL
ALT: 23 U/L (ref 0–53)
AST: 18 U/L (ref 0–37)
Albumin: 4.4 g/dL (ref 3.5–5.2)
Alkaline Phosphatase: 44 U/L (ref 39–117)
BILIRUBIN TOTAL: 0.5 mg/dL (ref 0.2–1.2)
BUN: 18 mg/dL (ref 6–23)
CO2: 28 mEq/L (ref 19–32)
CREATININE: 1.19 mg/dL (ref 0.50–1.35)
Calcium: 10 mg/dL (ref 8.4–10.5)
Chloride: 102 mEq/L (ref 96–112)
Glucose, Bld: 102 mg/dL — ABNORMAL HIGH (ref 70–99)
Potassium: 4.2 mEq/L (ref 3.5–5.3)
SODIUM: 138 meq/L (ref 135–145)
TOTAL PROTEIN: 7.7 g/dL (ref 6.0–8.3)

## 2014-12-12 LAB — CBC WITH DIFFERENTIAL/PLATELET
BASOS ABS: 0.1 10*3/uL (ref 0.0–0.1)
Basophils Relative: 1 % (ref 0–1)
EOS PCT: 3 % (ref 0–5)
Eosinophils Absolute: 0.2 10*3/uL (ref 0.0–0.7)
HCT: 44.1 % (ref 39.0–52.0)
HEMOGLOBIN: 14.3 g/dL (ref 13.0–17.0)
LYMPHS ABS: 4 10*3/uL (ref 0.7–4.0)
Lymphocytes Relative: 63 % — ABNORMAL HIGH (ref 12–46)
MCH: 28.7 pg (ref 26.0–34.0)
MCHC: 32.4 g/dL (ref 30.0–36.0)
MCV: 88.4 fL (ref 78.0–100.0)
MPV: 9.4 fL (ref 9.4–12.4)
Monocytes Absolute: 0.6 10*3/uL (ref 0.1–1.0)
Monocytes Relative: 10 % (ref 3–12)
NEUTROS PCT: 23 % — AB (ref 43–77)
Neutro Abs: 1.4 10*3/uL — ABNORMAL LOW (ref 1.7–7.7)
PLATELETS: 355 10*3/uL (ref 150–400)
RBC: 4.99 MIL/uL (ref 4.22–5.81)
RDW: 14.9 % (ref 11.5–15.5)
WBC: 6.3 10*3/uL (ref 4.0–10.5)

## 2014-12-12 LAB — LIPID PANEL
CHOL/HDL RATIO: 4.5 ratio
Cholesterol: 172 mg/dL (ref 0–200)
HDL: 38 mg/dL — ABNORMAL LOW (ref >39)
LDL CALC: 119 mg/dL — AB (ref 0–99)
TRIGLYCERIDES: 73 mg/dL (ref <150)
VLDL: 15 mg/dL (ref 0–40)

## 2014-12-12 LAB — URIC ACID: Uric Acid, Serum: 8 mg/dL — ABNORMAL HIGH (ref 4.0–7.8)

## 2014-12-12 NOTE — Assessment & Plan Note (Signed)
BP well controlled no change to meds 

## 2014-12-12 NOTE — Patient Instructions (Addendum)
Continue current medications  Work on weight loss and exercise Flu shot given F/U 4 months

## 2014-12-12 NOTE — Progress Notes (Signed)
Patient ID: James Murphy, male   DOB: October 29, 1966, 48 y.o.   MRN: [id number]   Subjective:    Patient ID: James AxonHenry Sanden, male    DOB: October 29, 1966, 48 y.o.   MRN: [id number]  Patient presents for CPE  Heel pain last about 1 hour on and off for past 3 months, no swelling, no injury, does not take any meds when it occurs. No history of gout but on HCTZ. Stands on feet most of day. DM- diet controlled last A1C 6.5% has gained 10 pounds since our last visit Taking all other meds as prescribed Flu shot today Immunizations UTD    Review Of Systems:  GEN- denies fatigue, fever, weight loss,weakness, recent illness HEENT- denies eye drainage, change in vision, nasal discharge, CVS- denies chest pain, palpitations RESP- denies SOB, cough, wheeze ABD- denies N/V, change in stools, abd pain GU- denies dysuria, hematuria, dribbling, incontinence MSK- + joint pain, muscle aches, injury Neuro- denies headache, dizziness, syncope, seizure activity       Objective:    BP 138/76 mmHg  Pulse 76  Temp(Src) 98.8 F (37.1 C) (Oral)  Resp 14  Ht 5\' 9"  (1.753 m)  Wt 257 lb (116.574 kg)  BMI 37.93 kg/m2 GEN- NAD, alert and oriented x3 HEENT- PERRL, EOMI, non injected sclera, pink conjunctiva, MMM, oropharynx clear, TM clear bilat Neck- Supple,  CVS- RRR, soft 2/6 SEM RESP-CTAB ABD-NABS,soft,NT,ND MSK- Bilat heel normal inspection, NT, no swelling, FROM ankle EXT- No edema Pulses- Radial, DP- 2+        Assessment & Plan:      Problem List Items Addressed This Visit      Unprioritized   Obesity   Hypertension   Hyperlipidemia   Relevant Orders      Lipid panel   Heel pain   Relevant Orders      Uric Acid   Diabetes   Relevant Orders      CBC with Differential      Comprehensive metabolic panel      Hemoglobin A1c    Other Visit Diagnoses    Routine general medical examination at a health care facility    -  Primary    CPE done, immunizations UTD, fasting labs today       Note: This dictation was prepared with Dragon dictation along with smaller phrase technology. Any transcriptional errors that result from this process are unintentional.

## 2014-12-12 NOTE — Assessment & Plan Note (Signed)
Diet controlled, discussed weight gain, dietary changes needed to be made, short term goals set Check A1C today, will see if meds need to be restarted

## 2014-12-12 NOTE — Assessment & Plan Note (Signed)
?   Heel spur, possible gout but does not sound like typical history,check Uric acid, states he has some discomfort today, if elevated D/C HCTZ and see how he does Advised NSAIDS, ICE to heel , but states he typically does not need anything Xray if this worsens

## 2014-12-13 LAB — HEMOGLOBIN A1C
Hgb A1c MFr Bld: 6.4 % — ABNORMAL HIGH (ref <5.7)
Mean Plasma Glucose: 137 mg/dL — ABNORMAL HIGH (ref <117)

## 2015-02-21 ENCOUNTER — Other Ambulatory Visit: Payer: Self-pay | Admitting: Family Medicine

## 2015-02-21 NOTE — Telephone Encounter (Signed)
Medication refilled per protocol. 

## 2015-02-23 ENCOUNTER — Encounter: Payer: 59 | Admitting: Family Medicine

## 2015-02-24 ENCOUNTER — Encounter: Payer: 59 | Admitting: Family Medicine

## 2015-02-27 ENCOUNTER — Encounter: Payer: Self-pay | Admitting: Family Medicine

## 2015-02-27 ENCOUNTER — Ambulatory Visit (INDEPENDENT_AMBULATORY_CARE_PROVIDER_SITE_OTHER): Payer: 59 | Admitting: Family Medicine

## 2015-02-27 VITALS — BP 134/84 | HR 94 | Ht 69.0 in | Wt 240.0 lb

## 2015-02-27 DIAGNOSIS — M79672 Pain in left foot: Secondary | ICD-10-CM

## 2015-02-27 DIAGNOSIS — M79671 Pain in right foot: Secondary | ICD-10-CM

## 2015-02-27 DIAGNOSIS — R269 Unspecified abnormalities of gait and mobility: Secondary | ICD-10-CM

## 2015-02-27 DIAGNOSIS — M2141 Flat foot [pes planus] (acquired), right foot: Secondary | ICD-10-CM

## 2015-02-27 DIAGNOSIS — M2142 Flat foot [pes planus] (acquired), left foot: Secondary | ICD-10-CM

## 2015-03-01 ENCOUNTER — Encounter: Payer: Self-pay | Admitting: Family Medicine

## 2015-03-01 DIAGNOSIS — M79672 Pain in left foot: Principal | ICD-10-CM

## 2015-03-01 DIAGNOSIS — R269 Unspecified abnormalities of gait and mobility: Secondary | ICD-10-CM | POA: Insufficient documentation

## 2015-03-01 DIAGNOSIS — M79671 Pain in right foot: Secondary | ICD-10-CM | POA: Insufficient documentation

## 2015-03-01 DIAGNOSIS — M2140 Flat foot [pes planus] (acquired), unspecified foot: Secondary | ICD-10-CM

## 2015-03-01 HISTORY — DX: Flat foot (pes planus) (acquired), unspecified foot: M21.40
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
## 2015-03-01 NOTE — Assessment & Plan Note (Signed)
plantar fasciitis with gait abnormality and pes planus.  Custom orthotics made today.  Felt very comfortable with reduced pain in the office.  Advised to call us for any adjustments over next week to month or f/u prn.  Patient was fitted for a : standard, cushioned, semi-rigid orthotic. The orthotic was heated and afterward the patient stood on the orthotic blank positioned on the orthotic stand. The patient was positioned in subtalar neutral position and 10 degrees of ankle dorsiflexion in a weight bearing stance. After completion of molding, a stable base was applied to the orthotic blank. The blank was ground to a stable position for weight bearing. Size: 14 blue swirl Base: blue med density eva Posting: none Additional orthotic padding: none Total prep time 45 minutes.

## 2015-03-06 ENCOUNTER — Ambulatory Visit: Payer: 59 | Admitting: Physical Therapy

## 2015-03-28 ENCOUNTER — Other Ambulatory Visit: Payer: Self-pay | Admitting: Family Medicine

## 2015-03-28 NOTE — Telephone Encounter (Signed)
Refill appropriate and filled per protocol. 

## 2015-04-14 ENCOUNTER — Encounter: Payer: Self-pay | Admitting: Family Medicine

## 2015-04-14 ENCOUNTER — Ambulatory Visit (INDEPENDENT_AMBULATORY_CARE_PROVIDER_SITE_OTHER): Payer: 59 | Admitting: Family Medicine

## 2015-04-14 VITALS — BP 140/88 | HR 82 | Temp 98.3°F | Resp 16 | Ht 69.0 in | Wt 254.0 lb

## 2015-04-14 DIAGNOSIS — E669 Obesity, unspecified: Secondary | ICD-10-CM | POA: Diagnosis not present

## 2015-04-14 DIAGNOSIS — E785 Hyperlipidemia, unspecified: Secondary | ICD-10-CM | POA: Diagnosis not present

## 2015-04-14 DIAGNOSIS — R7989 Other specified abnormal findings of blood chemistry: Secondary | ICD-10-CM

## 2015-04-14 DIAGNOSIS — E119 Type 2 diabetes mellitus without complications: Secondary | ICD-10-CM | POA: Diagnosis not present

## 2015-04-14 DIAGNOSIS — I10 Essential (primary) hypertension: Secondary | ICD-10-CM

## 2015-04-14 DIAGNOSIS — E790 Hyperuricemia without signs of inflammatory arthritis and tophaceous disease: Secondary | ICD-10-CM

## 2015-04-14 LAB — CBC WITH DIFFERENTIAL/PLATELET
BASOS ABS: 0.1 10*3/uL (ref 0.0–0.1)
BASOS PCT: 1 % (ref 0–1)
Eosinophils Absolute: 0.2 10*3/uL (ref 0.0–0.7)
Eosinophils Relative: 3 % (ref 0–5)
HEMATOCRIT: 44.6 % (ref 39.0–52.0)
HEMOGLOBIN: 14.2 g/dL (ref 13.0–17.0)
LYMPHS PCT: 54 % — AB (ref 12–46)
Lymphs Abs: 2.9 10*3/uL (ref 0.7–4.0)
MCH: 28.8 pg (ref 26.0–34.0)
MCHC: 31.8 g/dL (ref 30.0–36.0)
MCV: 90.5 fL (ref 78.0–100.0)
MPV: 9.7 fL (ref 8.6–12.4)
Monocytes Absolute: 0.6 10*3/uL (ref 0.1–1.0)
Monocytes Relative: 11 % (ref 3–12)
NEUTROS ABS: 1.6 10*3/uL — AB (ref 1.7–7.7)
Neutrophils Relative %: 31 % — ABNORMAL LOW (ref 43–77)
PLATELETS: 302 10*3/uL (ref 150–400)
RBC: 4.93 MIL/uL (ref 4.22–5.81)
RDW: 14.4 % (ref 11.5–15.5)
WBC: 5.3 10*3/uL (ref 4.0–10.5)

## 2015-04-14 LAB — LIPID PANEL
Cholesterol: 121 mg/dL (ref 0–200)
HDL: 41 mg/dL (ref >=40)
LDL Cholesterol: 71 mg/dL (ref 0–99)
Total CHOL/HDL Ratio: 3 Ratio
Triglycerides: 47 mg/dL (ref <150)
VLDL: 9 mg/dL (ref 0–40)

## 2015-04-14 LAB — COMPREHENSIVE METABOLIC PANEL
ALT: 56 U/L — ABNORMAL HIGH (ref 0–53)
AST: 29 U/L (ref 0–37)
Albumin: 4.3 g/dL (ref 3.5–5.2)
Alkaline Phosphatase: 57 U/L (ref 39–117)
BUN: 19 mg/dL (ref 6–23)
CO2: 27 mEq/L (ref 19–32)
Calcium: 9.7 mg/dL (ref 8.4–10.5)
Chloride: 104 mEq/L (ref 96–112)
Creat: 1.15 mg/dL (ref 0.50–1.35)
Glucose, Bld: 86 mg/dL (ref 70–99)
Potassium: 4.2 mEq/L (ref 3.5–5.3)
SODIUM: 140 meq/L (ref 135–145)
Total Bilirubin: 0.6 mg/dL (ref 0.2–1.2)
Total Protein: 7.4 g/dL (ref 6.0–8.3)

## 2015-04-14 LAB — HEMOGLOBIN A1C
Hgb A1c MFr Bld: 6.2 % — ABNORMAL HIGH (ref <5.7)
Mean Plasma Glucose: 131 mg/dL — ABNORMAL HIGH (ref <117)

## 2015-04-14 LAB — URIC ACID: Uric Acid, Serum: 5.2 mg/dL (ref 4.0–7.8)

## 2015-04-14 MED ORDER — BENAZEPRIL HCL 40 MG PO TABS: 40.0000 mg | ORAL_TABLET | Freq: Every day | ORAL | Status: DC

## 2015-04-14 MED ORDER — AMLODIPINE BESYLATE 10 MG PO TABS: 10.0000 mg | ORAL_TABLET | Freq: Every day | ORAL | Status: DC

## 2015-04-14 NOTE — Assessment & Plan Note (Signed)
Typically well controlled, no change to meds today, BP okay to continue current dose

## 2015-04-14 NOTE — Assessment & Plan Note (Signed)
Diet controlled, recheck fasting labs, discussed dietary changes, goal for weight loss of 10lbs

## 2015-04-14 NOTE — Progress Notes (Signed)
Patient ID: James Murphy, male   DOB: 1966/12/07, 49 y.o.   MRN: [id number]   Subjective:    Patient ID: James AxonHenry Murphy, male    DOB: 1966/12/07, 49 y.o.   MRN: [id number]  Patient presents for 4 month F/U Pt here to f/u medications, he has no particular concerns, doing well. [facility], had injections done and bilat orthotics which have helped some. He is taking zyrtec as needed for allergies  He has lost 3lbs but has not been watching his diet. He took BP meds about 30 minutes ago     Review Of Systems:  GEN- denies fatigue, fever, weight loss,weakness, recent illness HEENT- denies eye drainage, change in vision, nasal discharge, CVS- denies chest pain, palpitations RESP- denies SOB, cough, wheeze ABD- denies N/V, change in stools, abd pain GU- denies dysuria, hematuria, dribbling, incontinence MSK- denies joint pain, muscle aches, injury Neuro- denies headache, dizziness, syncope, seizure activity       Objective:    BP 140/88 mmHg  Pulse 82  Temp(Src) 98.3 F (36.8 C) (Oral)  Resp 16  Ht 5\' 9"  (1.753 m)  Wt 254 lb (115.214 kg)  BMI 37.49 kg/m2 GEN- NAD, alert and oriented x3 HEENT- PERRL, EOMI, non injected sclera, pink conjunctiva, MMM, oropharynx clear Neck- Supple, no thyromegaly CVS- RRR, 2/6 SEM  RESP-CTAB EXT- No edema Pulses- Radial, DP 2+        Assessment & Plan:      Problem List Items Addressed This Visit    None      Note: This dictation was prepared with Dragon dictation along with smaller phrase technology. Any transcriptional errors that result from this process are unintentional.

## 2015-04-14 NOTE — Patient Instructions (Signed)
Continue current medications Work on the dietary changes F/U 4 months

## 2015-04-14 NOTE — Assessment & Plan Note (Signed)
Off HCTZ, heel pain has significantly improved, recheck uric acid levels, if stll elevated will start allopurinol 100mg 

## 2015-06-05 ENCOUNTER — Encounter: Payer: Self-pay | Admitting: Family Medicine

## 2015-06-05 ENCOUNTER — Ambulatory Visit (INDEPENDENT_AMBULATORY_CARE_PROVIDER_SITE_OTHER): Payer: 59 | Admitting: Family Medicine

## 2015-06-05 VITALS — BP 138/74 | HR 72 | Temp 98.9°F | Resp 16 | Ht 69.0 in | Wt 249.0 lb

## 2015-06-05 DIAGNOSIS — J0100 Acute maxillary sinusitis, unspecified: Secondary | ICD-10-CM

## 2015-06-05 MED ORDER — AMOXICILLIN 875 MG PO TABS: 875.0000 mg | ORAL_TABLET | Freq: Two times a day (BID) | ORAL | Status: DC

## 2015-06-05 NOTE — Patient Instructions (Signed)
Take antibiotics Use nasal spray Continue Allegra Use Coricidin for cough F/U as needed

## 2015-06-05 NOTE — Progress Notes (Signed)
Patient ID: James Murphy, male   DOB: May 06, 1966, 49 y.o.   MRN: [id number]   Subjective:    Patient ID: James Murphy, male    DOB: [birth year], 48 y.o.   MRN: [id number]  Patient presents for Illness  patient here with sore throat initially that turn into sinus pressure and drainage for the past week. He has some cough with mild production. It is mostly nasal and he sneezes a lot and has a lot of pressure and pain in the sinus region. He has not had any significant fever but has had some chills. He has been using some over-the-counter anti-histamine with minimal improvement. He does not like to use a typical nasal steroids  because it causes more drainage. He states he was given some type of powder form of nasal spray by an allergist but he does not recall the name of this.    Review Of Systems:  GEN- denies fatigue, fever, weight loss,weakness, recent illness HEENT- denies eye drainage, change in vision,+ nasal discharge, CVS- denies chest pain, palpitations RESP- denies SOB, +cough, wheeze ABD- denies N/V, change in stools, abd pain GU- denies dysuria, hematuria, dribbling, incontinence MSK- denies joint pain, muscle aches, injury Neuro- denies headache, dizziness, syncope, seizure activity       Objective:    BP 138/74 mmHg  Pulse 72  Temp(Src) 98.9 F (37.2 C) (Oral)  Resp 16  Ht 5\' 9"  (1.753 m)  Wt 249 lb (112.946 kg)  BMI 36.75 kg/m2 GEN- NAD, alert and oriented x3 HEENT- PERRL, EOMI, non injected sclera, pink conjunctiva, MMM, oropharynx mild injection, TM clear bilat no effusion,  + maxillary sinus tenderness, inflammed turbinates,  Nasal drainage  Neck- Supple, no LAD CVS- RRR, no murmur RESP-CTAB EXT- No edema Pulses- Radial 2+         Assessment & Plan:      Problem List Items Addressed This Visit    None    Visit Diagnoses    Acute maxillary sinusitis, recurrence not specified    -  Primary    Amox prescribed, he will call with his nasal spray, zyrtec,  coridican for any cough    Relevant Medications    amoxicillin (AMOXIL) 875 MG tablet       Note: This dictation was prepared with Dragon dictation along with smaller phrase technology. Any transcriptional errors that result from this process are unintentional.

## 2015-08-14 ENCOUNTER — Ambulatory Visit: Payer: 59 | Admitting: Family Medicine

## 2015-08-21 ENCOUNTER — Ambulatory Visit (INDEPENDENT_AMBULATORY_CARE_PROVIDER_SITE_OTHER): Payer: 59 | Admitting: Family Medicine

## 2015-08-21 ENCOUNTER — Encounter: Payer: Self-pay | Admitting: Family Medicine

## 2015-08-21 VITALS — BP 140/96 | HR 84 | Temp 98.3°F | Resp 20 | Ht 69.0 in | Wt 253.0 lb

## 2015-08-21 DIAGNOSIS — E119 Type 2 diabetes mellitus without complications: Secondary | ICD-10-CM

## 2015-08-21 DIAGNOSIS — E669 Obesity, unspecified: Secondary | ICD-10-CM | POA: Diagnosis not present

## 2015-08-21 DIAGNOSIS — I10 Essential (primary) hypertension: Secondary | ICD-10-CM

## 2015-08-21 DIAGNOSIS — E785 Hyperlipidemia, unspecified: Secondary | ICD-10-CM | POA: Diagnosis not present

## 2015-08-21 LAB — COMPREHENSIVE METABOLIC PANEL
ALK PHOS: 42 U/L (ref 40–115)
ALT: 20 U/L (ref 9–46)
AST: 18 U/L (ref 10–40)
Albumin: 4.4 g/dL (ref 3.6–5.1)
BUN: 16 mg/dL (ref 7–25)
CHLORIDE: 104 mmol/L (ref 98–110)
CO2: 28 mmol/L (ref 20–31)
Calcium: 9.6 mg/dL (ref 8.6–10.3)
Creat: 1.13 mg/dL (ref 0.60–1.35)
GLUCOSE: 90 mg/dL (ref 70–99)
POTASSIUM: 4.3 mmol/L (ref 3.5–5.3)
Sodium: 139 mmol/L (ref 135–146)
Total Bilirubin: 0.5 mg/dL (ref 0.2–1.2)
Total Protein: 7.3 g/dL (ref 6.1–8.1)

## 2015-08-21 LAB — CBC WITH DIFFERENTIAL/PLATELET
Basophils Absolute: 0.1 10*3/uL (ref 0.0–0.1)
Basophils Relative: 1 % (ref 0–1)
EOS ABS: 0.3 10*3/uL (ref 0.0–0.7)
EOS PCT: 4 % (ref 0–5)
HCT: 43.2 % (ref 39.0–52.0)
Hemoglobin: 14.6 g/dL (ref 13.0–17.0)
LYMPHS ABS: 4.9 10*3/uL — AB (ref 0.7–4.0)
Lymphocytes Relative: 64 % — ABNORMAL HIGH (ref 12–46)
MCH: 29 pg (ref 26.0–34.0)
MCHC: 33.8 g/dL (ref 30.0–36.0)
MCV: 85.7 fL (ref 78.0–100.0)
MPV: 9.4 fL (ref 8.6–12.4)
Monocytes Absolute: 0.6 10*3/uL (ref 0.1–1.0)
Monocytes Relative: 8 % (ref 3–12)
Neutro Abs: 1.7 10*3/uL (ref 1.7–7.7)
Neutrophils Relative %: 23 % — ABNORMAL LOW (ref 43–77)
PLATELETS: 311 10*3/uL (ref 150–400)
RBC: 5.04 MIL/uL (ref 4.22–5.81)
RDW: 15 % (ref 11.5–15.5)
WBC: 7.6 10*3/uL (ref 4.0–10.5)

## 2015-08-21 LAB — LIPID PANEL
CHOL/HDL RATIO: 3.5 ratio (ref <=5.0)
Cholesterol: 135 mg/dL (ref 125–200)
HDL: 39 mg/dL — ABNORMAL LOW (ref >=40)
LDL CALC: 85 mg/dL (ref <130)
Triglycerides: 55 mg/dL (ref <150)
VLDL: 11 mg/dL (ref <30)

## 2015-08-21 LAB — HEMOGLOBIN A1C
Hgb A1c MFr Bld: 6.2 % — ABNORMAL HIGH (ref <5.7)
Mean Plasma Glucose: 131 mg/dL — ABNORMAL HIGH (ref <117)

## 2015-08-21 MED ORDER — BENAZEPRIL HCL 40 MG PO TABS: 40.0000 mg | ORAL_TABLET | Freq: Every day | ORAL | Status: DC

## 2015-08-21 MED ORDER — AMLODIPINE BESYLATE 10 MG PO TABS: 10.0000 mg | ORAL_TABLET | Freq: Every day | ORAL | Status: DC

## 2015-08-21 NOTE — Progress Notes (Signed)
Patient ID: James Murphy, male   DOB: 1966-08-17, 49 y.o.   MRN: [id number]   Subjective:    Patient ID: James Murphy, male    DOB: 01/31/1966, 49 y.o.   MRN: [id number]  Patient presents for Medication Management and Medication Refill  patient had a follow-up medications. He has no particular concerns. He states that he is out of his blood pressure medication. He had been taken regularly before he ran out a few days ago. He has not had any chest pain or headache no shortness of breath. He has history of diabetes which is now diet controlled but he has gained 4 pounds since her last visit. His last A1c was 6.2%.    Review Of Systems:  GEN- denies fatigue, fever, weight loss,weakness, recent illness HEENT- denies eye drainage, change in vision, nasal discharge, CVS- denies chest pain, palpitations RESP- denies SOB, cough, wheeze ABD- denies N/V, change in stools, abd pain GU- denies dysuria, hematuria, dribbling, incontinence MSK- denies joint pain, muscle aches, injury Neuro- denies headache, dizziness, syncope, seizure activity       Objective:    BP 140/96 mmHg  Pulse 84  Temp(Src) 98.3 F (36.8 C) (Oral)  Resp 20  Ht  (1.753 m)  Wt 253 lb (114.76 kg)  BMI 37.34 kg/m2 GEN- NAD, alert and oriented x3 HEENT- PERRL, EOMI, non injected sclera, pink conjunctiva, MMM, oropharynx clear Neck- Supple, no thyromegaly CVS- RRR, no murmur RESP-CTAB EXT- No edema Pulses- Radial, DP- 2+        Assessment & Plan:      Problem List Items Addressed This Visit    Obesity - Primary   Hypertension   Relevant Orders   CBC with Differential/Platelet   Comprehensive metabolic panel   Hyperlipidemia   Relevant Orders   Lipid panel   Diabetes   Relevant Orders   Hemoglobin A1c      Note: This dictation was prepared with Dragon dictation along with smaller phrase technology. Any transcriptional errors that result from this process are unintentional.

## 2015-08-21 NOTE — Patient Instructions (Signed)
Eat three meals a day  No fried foods, no fast food We will call with lab results  F/U 4 months

## 2015-08-21 NOTE — Assessment & Plan Note (Signed)
Pressures typically controlled when he has missed medications on board I'm not change them today refills made he will also have fasting labs

## 2015-08-21 NOTE — Assessment & Plan Note (Signed)
His custody importance of adherence to heart healthy diet and to keep his blood sugar regulated. I'm to recheck his A1c is I'm concerned with his change in diet and his weight gain that he may need medication again

## 2015-10-06 LAB — HM DIABETES EYE EXAM

## 2015-12-22 ENCOUNTER — Ambulatory Visit (INDEPENDENT_AMBULATORY_CARE_PROVIDER_SITE_OTHER): Payer: 59 | Admitting: Family Medicine

## 2015-12-22 ENCOUNTER — Encounter: Payer: Self-pay | Admitting: Family Medicine

## 2015-12-22 VITALS — BP 140/84 | HR 78 | Temp 97.6°F | Resp 16 | Ht 69.0 in | Wt 262.0 lb

## 2015-12-22 DIAGNOSIS — Z23 Encounter for immunization: Secondary | ICD-10-CM | POA: Diagnosis not present

## 2015-12-22 DIAGNOSIS — E119 Type 2 diabetes mellitus without complications: Secondary | ICD-10-CM | POA: Diagnosis not present

## 2015-12-22 DIAGNOSIS — I10 Essential (primary) hypertension: Secondary | ICD-10-CM

## 2015-12-22 DIAGNOSIS — E785 Hyperlipidemia, unspecified: Secondary | ICD-10-CM | POA: Diagnosis not present

## 2015-12-22 DIAGNOSIS — E669 Obesity, unspecified: Secondary | ICD-10-CM

## 2015-12-22 DIAGNOSIS — M722 Plantar fascial fibromatosis: Secondary | ICD-10-CM

## 2015-12-22 LAB — CBC WITH DIFFERENTIAL/PLATELET
BASOS PCT: 1 % (ref 0–1)
Basophils Absolute: 0.1 10*3/uL (ref 0.0–0.1)
Eosinophils Absolute: 0.2 10*3/uL (ref 0.0–0.7)
Eosinophils Relative: 3 % (ref 0–5)
HCT: 42.3 % (ref 39.0–52.0)
HEMOGLOBIN: 14.1 g/dL (ref 13.0–17.0)
LYMPHS ABS: 4.1 10*3/uL — AB (ref 0.7–4.0)
Lymphocytes Relative: 61 % — ABNORMAL HIGH (ref 12–46)
MCH: 28.9 pg (ref 26.0–34.0)
MCHC: 33.3 g/dL (ref 30.0–36.0)
MCV: 86.7 fL (ref 78.0–100.0)
MONOS PCT: 9 % (ref 3–12)
MPV: 9.3 fL (ref 8.6–12.4)
Monocytes Absolute: 0.6 10*3/uL (ref 0.1–1.0)
NEUTROS ABS: 1.7 10*3/uL (ref 1.7–7.7)
NEUTROS PCT: 26 % — AB (ref 43–77)
Platelets: 338 10*3/uL (ref 150–400)
RBC: 4.88 MIL/uL (ref 4.22–5.81)
RDW: 14.3 % (ref 11.5–15.5)
WBC: 6.7 10*3/uL (ref 4.0–10.5)

## 2015-12-22 LAB — COMPREHENSIVE METABOLIC PANEL
ALBUMIN: 4.4 g/dL (ref 3.6–5.1)
ALK PHOS: 41 U/L (ref 40–115)
ALT: 24 U/L (ref 9–46)
AST: 18 U/L (ref 10–40)
BILIRUBIN TOTAL: 0.5 mg/dL (ref 0.2–1.2)
BUN: 22 mg/dL (ref 7–25)
CHLORIDE: 103 mmol/L (ref 98–110)
CO2: 26 mmol/L (ref 20–31)
CREATININE: 1.14 mg/dL (ref 0.60–1.35)
Calcium: 9.6 mg/dL (ref 8.6–10.3)
Glucose, Bld: 88 mg/dL (ref 70–99)
Potassium: 4.1 mmol/L (ref 3.5–5.3)
SODIUM: 138 mmol/L (ref 135–146)
TOTAL PROTEIN: 7.4 g/dL (ref 6.1–8.1)

## 2015-12-22 LAB — LIPID PANEL
CHOLESTEROL: 122 mg/dL — AB (ref 125–200)
HDL: 39 mg/dL — ABNORMAL LOW (ref >=40)
LDL Cholesterol: 73 mg/dL (ref <130)
TRIGLYCERIDES: 51 mg/dL (ref <150)
Total CHOL/HDL Ratio: 3.1 Ratio (ref <=5.0)
VLDL: 10 mg/dL (ref <30)

## 2015-12-22 NOTE — Assessment & Plan Note (Signed)
Blood pressure a little elevated today however he does take his medications. Typically it is well-controlled and change her medication today

## 2015-12-22 NOTE — Progress Notes (Signed)
Patient ID: James Murphy, male   DOB: 03/29/1966, 49 y.o.   MRN: [id number]   Subjective:    Patient ID: James Murphy, male    DOB: 03/29/1966, 49 y.o.   MRN: [id number]  Patient presents for 4 month F/U  patient had follow-up. His history of diet-controlled diabetes his last A1c was 6.2% however he has gained 10 pounds since her last visit 4 months ago. He has not been eating very healthy and he has not been exercising. He is still taking his blood pressure medicines yesterday took them about 30 minutes before visit. He denies any side effects with his medications. His only concern is continued bilateral heel pain. He was seen by sports medicine he had custom orthotics done as he has severe pes planus as well as plantar fasciitis which they did give him a steroid injection but there was no improvement. He will like to see if there is anything else that can be done. He was unable to do physical therapy because of the time commitment.    Review Of Systems:  GEN- denies fatigue, fever, weight loss,weakness, recent illness HEENT- denies eye drainage, change in vision, nasal discharge, CVS- denies chest pain, palpitations RESP- denies SOB, cough, wheeze ABD- denies N/V, change in stools, abd pain GU- denies dysuria, hematuria, dribbling, incontinence MSK- denies joint pain, muscle aches, injury Neuro- denies headache, dizziness, syncope, seizure activity       Objective:    BP 140/84 mmHg  Pulse 78  Temp(Src) 97.6 F (36.4 C) (Oral)  Resp 16  Ht 5\' 9"  (1.753 m)  Wt 262 lb (118.842 kg)  BMI 38.67 kg/m2 GEN- NAD, alert and oriented x3,obese HEENT- PERRL, EOMI, non injected sclera, pink conjunctiva, MMM, oropharynx clear CVS- RRR, no murmur RESP-CTAB MSK-TTP bilat heels- at plantar fasia insertion, no swelling, no bony abnormality EXT- No edema Pulses- Radial, DP- 2+        Assessment & Plan:      Problem List Items Addressed This Visit    Obesity - Primary   Hypertension   Blood pressure a little elevated today however he does take his medications. Typically it is well-controlled and change her medication today      Hyperlipidemia   Relevant Orders   Lipid panel   Diabetes (HCC)    Concern that his A1c may be going up as he is gaining more weight over the past 4-6 months. I will recheck his labs today. We had a long discussion regarding his overall health and his knee for healthy eating and weight loss. Short-term goals up and set.      Relevant Orders   CBC with Differential/Platelet   Comprehensive metabolic panel   Hemoglobin A1c   HM DIABETES FOOT EXAM (Completed)    Other Visit Diagnoses    Plantar fasciitis, bilateral        Referral to podiatry    Need for prophylactic vaccination and inoculation against influenza        Relevant Orders    Flu Vaccine QUAD 36+ mos PF IM (Fluarix & Fluzone Quad PF) (Completed)       Note: This dictation was prepared with Dragon dictation along with smaller phrase technology. Any transcriptional errors that result from this process are unintentional.

## 2015-12-22 NOTE — Patient Instructions (Signed)
Continue current medication Podiatry referral Stretch the heels  Work on the weight loss F/U 3 months for PHYSICAL

## 2015-12-22 NOTE — Assessment & Plan Note (Signed)
Concern that his A1c may be going up as he is gaining more weight over the past 4-6 months. I will recheck his labs today. We had a long discussion regarding his overall health and his knee for healthy eating and weight loss. Short-term goals up and set.

## 2015-12-23 LAB — HEMOGLOBIN A1C
Hgb A1c MFr Bld: 6.3 % — ABNORMAL HIGH (ref <5.7)
Mean Plasma Glucose: 134 mg/dL — ABNORMAL HIGH (ref <117)

## 2015-12-26 ENCOUNTER — Encounter: Payer: Self-pay | Admitting: *Deleted

## 2016-01-26 ENCOUNTER — Ambulatory Visit (INDEPENDENT_AMBULATORY_CARE_PROVIDER_SITE_OTHER): Payer: 59 | Admitting: Family Medicine

## 2016-01-26 ENCOUNTER — Other Ambulatory Visit: Payer: Self-pay | Admitting: Family Medicine

## 2016-01-26 ENCOUNTER — Encounter: Payer: Self-pay | Admitting: Family Medicine

## 2016-01-26 VITALS — BP 128/74 | HR 82 | Temp 99.4°F | Resp 16 | Ht 69.0 in | Wt 258.0 lb

## 2016-01-26 DIAGNOSIS — J069 Acute upper respiratory infection, unspecified: Secondary | ICD-10-CM | POA: Diagnosis not present

## 2016-01-26 LAB — INFLUENZA A AND B AG, IMMUNOASSAY
INFLUENZA A ANTIGEN: NOT DETECTED
INFLUENZA B ANTIGEN: NOT DETECTED

## 2016-01-26 MED ORDER — FLUTICASONE PROPIONATE 50 MCG/ACT NA SUSP: 2.0000 | Freq: Two times a day (BID) | NASAL | Status: DC | PRN

## 2016-01-26 MED ORDER — GUAIFENESIN-CODEINE 100-10 MG/5ML PO SOLN: 10.0000 mL | Freq: Four times a day (QID) | ORAL | Status: DC | PRN

## 2016-01-26 MED FILL — FLUTICASONE PROP 50 MCG SPR: 50 | 30 days supply | Qty: 16 | Fill #0

## 2016-01-26 MED FILL — CHERATUSSIN AC SYRUP: 100-10 | 5 days supply | Qty: 180 | Fill #0

## 2016-01-26 NOTE — Progress Notes (Signed)
Patient ID: James Murphy, male   DOB: [birth year], 50 y.o.   MRN: [id number]   Subjective:    Patient ID: James Murphy, male    DOB: Oct 17, 1966, 50 y.o.   MRN: [id number]  Patient presents for Illness  Patient here with cough with production body aches and subjective fever headache nasal drainage for the past 4 days. He is not taking any over-the-counter medications. He denies any difficulty breathing no nausea vomiting diarrhea. No known sick contacts. Worst part is his nasal drainage and the body aches.    Review Of Systems:  GEN- + fatigue+, fever, weight loss,weakness, recent illness HEENT- denies eye drainage, change in vision,+ nasal discharge, CVS- denies chest pain, palpitations RESP- denies SOB, cough, wheeze ABD- denies N/V, change in stools, abd pain MSK- denies joint pain, +muscle aches, injury Neuro-+ headache, dizziness, syncope, seizure activity       Objective:    BP 128/74 mmHg  Pulse 82  Temp(Src) 99.4 F (37.4 C) (Oral)  Resp 16  Ht  (1.753 m)  Wt 258 lb (117.028 kg)  BMI 38.08 kg/m2 GEN- NAD, alert and oriented x3 HEENT- PERRL, EOMI, non injected sclera, pink conjunctiva, MMM, oropharynx mild injection, TM clear bilat no effusion, no  maxillary sinus tenderness,+ inflammed turbinates,  =Nasal drainage  Neck- Supple, shotty anterior  LAD CVS- RRR,  Soft systolic murmur RESP-CTAB EXT- No edema Pulses- Radial 2+   Flu neg      Assessment & Plan:      Problem List Items Addressed This Visit    None    Visit Diagnoses    Viral URI    -  Primary    Viral illness, flonase, robitussin AC,rest    Relevant Orders    Influenza a and b       Note: This dictation was prepared with Dragon dictation along with smaller phrase technology. Any transcriptional errors that result from this process are unintentional.

## 2016-01-26 NOTE — Patient Instructions (Signed)
Cough medicine at bedtime  Use nasal saline/flonase

## 2016-03-01 ENCOUNTER — Ambulatory Visit: Payer: 59 | Admitting: Podiatry

## 2016-03-08 ENCOUNTER — Ambulatory Visit (INDEPENDENT_AMBULATORY_CARE_PROVIDER_SITE_OTHER): Payer: 59 | Admitting: Podiatry

## 2016-03-08 ENCOUNTER — Encounter: Payer: Self-pay | Admitting: Podiatry

## 2016-03-08 ENCOUNTER — Ambulatory Visit (INDEPENDENT_AMBULATORY_CARE_PROVIDER_SITE_OTHER): Payer: 59

## 2016-03-08 VITALS — BP 142/92 | HR 99 | Resp 12

## 2016-03-08 DIAGNOSIS — M79671 Pain in right foot: Secondary | ICD-10-CM

## 2016-03-08 DIAGNOSIS — M79672 Pain in left foot: Secondary | ICD-10-CM

## 2016-03-08 DIAGNOSIS — M722 Plantar fascial fibromatosis: Secondary | ICD-10-CM

## 2016-03-08 MED ORDER — TRIAMCINOLONE ACETONIDE 10 MG/ML IJ SUSP
10.0000 mg | Freq: Once | INTRAMUSCULAR | Status: AC
  Administered 2016-03-08: 10 mg

## 2016-03-08 MED ORDER — DICLOFENAC SODIUM 75 MG PO TBEC
75.0000 mg | DELAYED_RELEASE_TABLET | Freq: Two times a day (BID) | ORAL | Status: DC
Start: 2016-03-08 — End: 2016-05-10

## 2016-03-08 MED FILL — DICLOFENAC SOD EC 75 MG TAB: 75 | 25 days supply | Qty: 50 | Fill #0

## 2016-03-08 MED FILL — PRAVASTATIN NA 40 MG TAB: 40 | 90 days supply | Qty: 90 | Fill #3

## 2016-03-08 MED FILL — AMLODIPINE BESYLATE 10 MG T: 10 | 90 days supply | Qty: 90 | Fill #0

## 2016-03-08 NOTE — Progress Notes (Signed)
   Subjective:    Patient ID: James Murphy, male    DOB: September 17, 1966, 50 y.o.   MRN: [id number]  HPI PT STATED BOTTOM OF THE HEEL IS BEEN HURTING FOR 6 MONTH. HEEL IS GETTING WORSE ESPECIALLY FIRST STEP IN THE  MORNING. DR? PRESCRIBE CORTISONE INJECTION, INSERTS.  Review of Systems  HENT: Positive for sinus pressure and sneezing.   Eyes: Positive for redness and itching.       Objective:   Physical Exam        Assessment & Plan:

## 2016-03-08 NOTE — Patient Instructions (Signed)

## 2016-03-08 NOTE — Progress Notes (Signed)
Subjective:     Patient ID: James Murphy, male   DOB: 23-Jul-1966, 50 y.o.   MRN: [id number]  HPI patient states that had a lot of pain in the bottom of both my heels for 6 months with the right being worse than the left. I think I had a cortisone injection 6 months ago and some kind of over-the-counter inserts but nothing else   Review of Systems  All other systems reviewed and are negative.      Objective:   Physical Exam  Constitutional: He is oriented to person, place, and time.  Cardiovascular: Intact distal pulses.   Musculoskeletal: Normal range of motion.  Neurological: He is oriented to person, place, and time.  Skin: Skin is warm.  Nursing note and vitals reviewed.  neurovascular status intact muscle strength adequate range of motion within normal limits with patient found to have exquisite plantar heel pain right over left with fluid buildup and significant depression of the arch along with structural bunion deformities bilateral. Patient has good digital perfusion and is well oriented 3     Assessment:     Acute plantar fasciitis right over left with inflammation and fluid around the medial band with structural flatfoot deformity bilateral and bunion deformity    Plan:     H&P and x-rays of both feet reviewed. Today I injected the plantar fascia bilateral 3 mg Kenalog 5 mg Xylocaine and applied fascial brace bilateral and gave instructions on physical therapy and shoe gear modifications. I reviewed x-rays and I also wrote a prescription for diclofenac  X-ray report indicated spur formation but no indication stress fracture and structural bunion deformity bilateral with flatfoot deformity noted

## 2016-03-22 ENCOUNTER — Encounter: Payer: Self-pay | Admitting: Podiatry

## 2016-03-22 ENCOUNTER — Encounter: Payer: 59 | Admitting: Family Medicine

## 2016-03-22 ENCOUNTER — Ambulatory Visit (INDEPENDENT_AMBULATORY_CARE_PROVIDER_SITE_OTHER): Payer: 59 | Admitting: Podiatry

## 2016-03-22 VITALS — BP 155/79 | HR 83 | Resp 16

## 2016-03-22 DIAGNOSIS — M722 Plantar fascial fibromatosis: Secondary | ICD-10-CM | POA: Diagnosis not present

## 2016-03-22 NOTE — Progress Notes (Signed)
Subjective:     Patient ID: James Murphy, male   DOB: 11/25/1966, 50 y.o.   MRN: [id number]  HPI patient states I'm doing so much better with my feet but I know I have very flat feet and I have had a long-term history of heel pain   Review of Systems     Objective:   Physical Exam Neurovascular status intact muscle strength adequate range of motion within normal limits with patient noted to have pain in the plantar heels when pressed deeply but significant depression of the arch is noted bilateral    Assessment:     Severe plantar fasciitis bilateral with biomechanical dysfunction    Plan:     H&P conditions reviewed with patient. I went ahead and scanned for custom orthotics to reduce plantar pressure today and explained utilization and stretching exercises. Reappoint to recheck

## 2016-03-29 ENCOUNTER — Encounter: Payer: Self-pay | Admitting: Family Medicine

## 2016-04-01 ENCOUNTER — Encounter: Payer: Self-pay | Admitting: Family Medicine

## 2016-04-01 ENCOUNTER — Ambulatory Visit (INDEPENDENT_AMBULATORY_CARE_PROVIDER_SITE_OTHER): Payer: 59 | Admitting: Family Medicine

## 2016-04-01 VITALS — BP 148/92 | HR 88 | Temp 98.6°F | Resp 16 | Ht 69.0 in | Wt 258.0 lb

## 2016-04-01 DIAGNOSIS — E119 Type 2 diabetes mellitus without complications: Secondary | ICD-10-CM | POA: Diagnosis not present

## 2016-04-01 DIAGNOSIS — I10 Essential (primary) hypertension: Secondary | ICD-10-CM

## 2016-04-01 DIAGNOSIS — E669 Obesity, unspecified: Secondary | ICD-10-CM | POA: Diagnosis not present

## 2016-04-01 DIAGNOSIS — J302 Other seasonal allergic rhinitis: Secondary | ICD-10-CM | POA: Diagnosis not present

## 2016-04-01 MED ORDER — AMLODIPINE BESYLATE 10 MG PO TABS: 10.0000 mg | ORAL_TABLET | Freq: Every day | ORAL | Status: DC

## 2016-04-01 MED ORDER — LEVOCETIRIZINE DIHYDROCHLORIDE 5 MG PO TABS: 5.0000 mg | ORAL_TABLET | Freq: Every evening | ORAL | Status: DC

## 2016-04-01 MED ORDER — BENAZEPRIL HCL 40 MG PO TABS: 40.0000 mg | ORAL_TABLET | Freq: Every day | ORAL | Status: DC

## 2016-04-01 MED FILL — LEVOCETIRIZINE 5 MG TABLET: 5 | 30 days supply | Qty: 30 | Fill #0

## 2016-04-01 MED FILL — BENAZEPRIL HCL 40 MG TABLET: 40 | 90 days supply | Qty: 90 | Fill #0

## 2016-04-01 NOTE — Assessment & Plan Note (Signed)
He has tried multiple over-the-counter medications including Claritin and Zyrtec Allegra will try him on Xyzal into his Flonase

## 2016-04-01 NOTE — Patient Instructions (Signed)
F/U 6 months for Physical  Take xyzal along with flonase for allergies

## 2016-04-01 NOTE — Assessment & Plan Note (Signed)
Currently diet controlled however A1c has been increasing over time. We'll recheck his A1c today.

## 2016-04-01 NOTE — Assessment & Plan Note (Signed)
Blood pressure uncontrolled as he is not on his proper medications. We'll restart his benazepril he will continue the amlodipine as well as a discussed the importance of dietary changes and weight loss and how this affects his blood pressure and his diabetes. We'll check his renal function today.

## 2016-04-01 NOTE — Progress Notes (Signed)
Patient ID: James Murphy, male   DOB: [birth year], 50 y.o.   MRN: [id number]    Subjective:    Patient ID: James AxonHenry Murphy, male    DOB: [birth year], 50 y.o.   MRN: [id number]  Patient presents for F/U Patient here for follow-up on chronic medical problems. He continues to have problems with his allergies. He is taking the Flonase and Zyrtec but they're still not controlled. He denies any fever or sinusitis symptoms. Next  Hypertension he is out of one of his blood pressure medications benazepril states the pharmacy states it was too early for him to fill this. He has been out for a couple weeks. He is taking his cholesterol medicine and his aspirin as prescribed. He has been no significant change in his weight.      Review Of Systems:  GEN- denies fatigue, fever, weight loss,weakness, recent illness HEENT- denies eye drainage, change in vision, +nasal discharge, CVS- denies chest pain, palpitations RESP- denies SOB, cough, wheeze ABD- denies N/V, change in stools, abd pain GU- denies dysuria, hematuria, dribbling, incontinence MSK- denies joint pain, muscle aches, injury Neuro- denies headache, dizziness, syncope, seizure activity       Objective:    BP 148/92 mmHg  Pulse 88  Temp(Src) 98.6 F (37 C) (Oral)  Resp 16  Ht 5\' 9"  (1.753 m)  Wt 258 lb (117.028 kg)  BMI 38.08 kg/m2 GEN- NAD, alert and oriented x3 HEENT- PERRL, EOMI, non injected sclera, pink conjunctiva, MMM, oropharynx clear,clear rhinorrhea Neck- Supple, no thyromegaly CVS- RRR, 1/6 systolic murmur RESP-CTAB EXT- No edema Pulses- Radial  2+        Assessment & Plan:      Problem List Items Addressed This Visit    Seasonal allergies    He has tried multiple over-the-counter medications including Claritin and Zyrtec Allegra will try him on Xyzal into his Flonase      Obesity   Hypertension    Blood pressure uncontrolled as he is not on his proper medications. We'll restart his benazepril he will continue  the amlodipine as well as a discussed the importance of dietary changes and weight loss and how this affects his blood pressure and his diabetes. We'll check his renal function today.      Relevant Medications   benazepril (LOTENSIN) 40 MG tablet   amLODipine (NORVASC) 10 MG tablet   Diabetes (HCC) - Primary    Currently diet controlled however A1c has been increasing over time. We'll recheck his A1c today.      Relevant Medications   benazepril (LOTENSIN) 40 MG tablet   Other Relevant Orders   Hemoglobin A1c   CBC with Differential/Platelet   Basic metabolic panel      Note: This dictation was prepared with Dragon dictation along with smaller phrase technology. Any transcriptional errors that result from this process are unintentional.

## 2016-04-02 LAB — CBC WITH DIFFERENTIAL/PLATELET
BASOS ABS: 71 {cells}/uL (ref 0–200)
BASOS PCT: 1 %
EOS ABS: 284 {cells}/uL (ref 15–500)
Eosinophils Relative: 4 %
HEMATOCRIT: 42.6 % (ref 38.5–50.0)
Hemoglobin: 13.7 g/dL (ref 13.0–17.0)
LYMPHS PCT: 63 %
Lymphs Abs: 4473 cells/uL — ABNORMAL HIGH (ref 850–3900)
MCH: 29 pg (ref 27.0–33.0)
MCHC: 32.2 g/dL (ref 32.0–36.0)
MCV: 90.1 fL (ref 80.0–100.0)
MONO ABS: 568 {cells}/uL (ref 200–950)
MPV: 9.5 fL (ref 7.5–12.5)
Monocytes Relative: 8 %
Neutro Abs: 1704 cells/uL (ref 1500–7800)
Neutrophils Relative %: 24 %
Platelets: 328 10*3/uL (ref 140–400)
RBC: 4.73 MIL/uL (ref 4.20–5.80)
RDW: 14.4 % (ref 11.0–15.0)
WBC: 7.1 10*3/uL (ref 3.8–10.8)

## 2016-04-02 LAB — BASIC METABOLIC PANEL
BUN: 18 mg/dL (ref 7–25)
CALCIUM: 9.4 mg/dL (ref 8.6–10.3)
CHLORIDE: 104 mmol/L (ref 98–110)
CO2: 25 mmol/L (ref 20–31)
CREATININE: 1.12 mg/dL (ref 0.60–1.35)
Glucose, Bld: 83 mg/dL (ref 70–99)
Potassium: 4.4 mmol/L (ref 3.5–5.3)
Sodium: 140 mmol/L (ref 135–146)

## 2016-04-02 LAB — HEMOGLOBIN A1C
Hgb A1c MFr Bld: 6.4 % — ABNORMAL HIGH (ref <5.7)
Mean Plasma Glucose: 137 mg/dL

## 2016-04-24 ENCOUNTER — Ambulatory Visit (INDEPENDENT_AMBULATORY_CARE_PROVIDER_SITE_OTHER): Payer: 59 | Admitting: *Deleted

## 2016-04-24 DIAGNOSIS — M722 Plantar fascial fibromatosis: Secondary | ICD-10-CM

## 2016-04-24 NOTE — Progress Notes (Signed)
Patient ID: James Murphy, male   DOB: Jul 18, 1966, 50 y.o.   MRN: [id number] Patient presents for orthotic pick up.  Verbal and written break in and wear instructions given.  Patient will follow up in 4 weeks if symptoms worsen or fail to improve.

## 2016-04-24 NOTE — Patient Instructions (Signed)

## 2016-04-25 DIAGNOSIS — Z79899 Other long term (current) drug therapy: Secondary | ICD-10-CM | POA: Diagnosis not present

## 2016-04-25 DIAGNOSIS — Z7982 Long term (current) use of aspirin: Secondary | ICD-10-CM | POA: Diagnosis not present

## 2016-04-25 DIAGNOSIS — F17200 Nicotine dependence, unspecified, uncomplicated: Secondary | ICD-10-CM | POA: Diagnosis not present

## 2016-04-25 DIAGNOSIS — S01511A Laceration without foreign body of lip, initial encounter: Secondary | ICD-10-CM | POA: Diagnosis not present

## 2016-04-25 DIAGNOSIS — Z23 Encounter for immunization: Secondary | ICD-10-CM | POA: Diagnosis not present

## 2016-04-25 MED FILL — HYDROCODON-APAP 5-325: 5-325 | 2 days supply | Qty: 10 | Fill #0

## 2016-04-25 MED FILL — CEPHALEXIN 500 MG CAPSULE: 500 | 5 days supply | Qty: 10 | Fill #0

## 2016-05-10 ENCOUNTER — Ambulatory Visit (HOSPITAL_COMMUNITY)
Admission: RE | Admit: 2016-05-10 | Discharge: 2016-05-10 | Disposition: A | Discharge status: Home / Self Care | Payer: 59 | Source: Ambulatory Visit | Attending: Family Medicine | Admitting: Family Medicine

## 2016-05-10 ENCOUNTER — Ambulatory Visit (INDEPENDENT_AMBULATORY_CARE_PROVIDER_SITE_OTHER): Payer: 59 | Admitting: Family Medicine

## 2016-05-10 VITALS — BP 126/84 | HR 88 | Temp 98.4°F | Resp 17 | Ht 69.0 in | Wt 255.8 lb

## 2016-05-10 DIAGNOSIS — M79604 Pain in right leg: Secondary | ICD-10-CM | POA: Diagnosis not present

## 2016-05-10 DIAGNOSIS — M7989 Other specified soft tissue disorders: Secondary | ICD-10-CM | POA: Insufficient documentation

## 2016-05-10 NOTE — Progress Notes (Signed)
*  Preliminary Results* Right lower extremity venous duplex completed. Right lower extremity is negative for deep vein thrombosis. There is no evidence of right Baker's cyst.  Incidental finding: There is a heterogenous area of the right proximal medial calf in the area of concern that measures 7.7 x 3.7cm; etiology unknown. Also, there are multiple heterogenous areas of the right groin, suggestive of possible prominent inguinal lymph nodes  05/10/2016 6:41 PM  Gertie FeyMichelle Maurita Murphy, RVT, RDCS, RDMS
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
## 2016-05-10 NOTE — Patient Instructions (Addendum)
[facility] and go to admitting.  Go directly to hospital when leaving our office.    IF you received an x-ray today, you will receive an invoice from [facility]. Please contact [facility] at [phone] with questions or concerns regarding your invoice.   IF you received labwork today, you will receive an invoice from [facility]. Please contact [facility] at [phone] with questions or concerns regarding your invoice.   Our billing staff will not be able to assist you with questions regarding bills from these companies.  You will be contacted with the lab results as soon as they are available. The fastest way to get your results is to activate your My Chart account. Instructions are located on the last page of this paperwork. If you have not heard from us regarding the results in 2 weeks, please contact this office.

## 2016-05-11 DIAGNOSIS — M25561 Pain in right knee: Secondary | ICD-10-CM | POA: Diagnosis not present

## 2016-05-11 DIAGNOSIS — M660 Rupture of popliteal cyst: Secondary | ICD-10-CM | POA: Diagnosis not present

## 2016-05-15 ENCOUNTER — Ambulatory Visit: Payer: 59 | Admitting: Family Medicine

## 2016-05-16 ENCOUNTER — Ambulatory Visit (HOSPITAL_COMMUNITY)
Admission: RE | Admit: 2016-05-16 | Discharge: 2016-05-16 | Disposition: A | Discharge status: Home / Self Care | Payer: 59 | Source: Ambulatory Visit | Attending: Sports Medicine | Admitting: Sports Medicine

## 2016-05-16 ENCOUNTER — Other Ambulatory Visit (HOSPITAL_COMMUNITY): Payer: Self-pay | Admitting: Sports Medicine

## 2016-05-16 DIAGNOSIS — Z01818 Encounter for other preprocedural examination: Secondary | ICD-10-CM | POA: Diagnosis not present

## 2016-05-16 DIAGNOSIS — S8991XA Unspecified injury of right lower leg, initial encounter: Secondary | ICD-10-CM

## 2016-05-16 DIAGNOSIS — M25561 Pain in right knee: Secondary | ICD-10-CM | POA: Diagnosis not present

## 2016-05-16 DIAGNOSIS — M7989 Other specified soft tissue disorders: Secondary | ICD-10-CM | POA: Diagnosis not present

## 2016-05-16 DIAGNOSIS — M79661 Pain in right lower leg: Secondary | ICD-10-CM | POA: Diagnosis not present

## 2016-05-21 DIAGNOSIS — M79661 Pain in right lower leg: Secondary | ICD-10-CM | POA: Diagnosis not present

## 2016-06-10 DIAGNOSIS — M79661 Pain in right lower leg: Secondary | ICD-10-CM | POA: Diagnosis not present

## 2016-06-10 DIAGNOSIS — M25561 Pain in right knee: Secondary | ICD-10-CM | POA: Diagnosis not present

## 2016-06-10 DIAGNOSIS — M7989 Other specified soft tissue disorders: Secondary | ICD-10-CM | POA: Diagnosis not present

## 2016-06-10 DIAGNOSIS — M660 Rupture of popliteal cyst: Secondary | ICD-10-CM | POA: Diagnosis not present

## 2016-06-26 ENCOUNTER — Other Ambulatory Visit: Payer: Self-pay | Admitting: Family Medicine

## 2016-06-26 MED FILL — PRAVASTATIN NA 40 MG TAB: 40 | 90 days supply | Qty: 90 | Fill #0

## 2016-06-26 MED FILL — AMLODIPINE BESYLATE 10 MG T: 10 | 90 days supply | Qty: 90 | Fill #0

## 2016-06-26 NOTE — Telephone Encounter (Signed)
Pt is requesting a refill of Pravastatin 40 mg, Amlodipine 10 mg and Benazepril 40 mg [facility]

## 2016-06-26 NOTE — Telephone Encounter (Signed)
Refill appropriate and filled per protocol. 

## 2016-07-17 MED FILL — BENAZEPRIL HCL 40 MG TABLET: 40 | 90 days supply | Qty: 90 | Fill #0

## 2016-10-01 ENCOUNTER — Encounter: Payer: Self-pay | Admitting: Family Medicine

## 2016-10-01 ENCOUNTER — Ambulatory Visit (INDEPENDENT_AMBULATORY_CARE_PROVIDER_SITE_OTHER): Payer: 59 | Admitting: Family Medicine

## 2016-10-01 VITALS — BP 124/82 | HR 86 | Temp 97.5°F | Resp 16 | Wt 258.0 lb

## 2016-10-01 DIAGNOSIS — E7800 Pure hypercholesterolemia, unspecified: Secondary | ICD-10-CM

## 2016-10-01 DIAGNOSIS — Z23 Encounter for immunization: Secondary | ICD-10-CM

## 2016-10-01 DIAGNOSIS — E6609 Other obesity due to excess calories: Secondary | ICD-10-CM

## 2016-10-01 DIAGNOSIS — E119 Type 2 diabetes mellitus without complications: Secondary | ICD-10-CM | POA: Diagnosis not present

## 2016-10-01 DIAGNOSIS — I10 Essential (primary) hypertension: Secondary | ICD-10-CM | POA: Diagnosis not present

## 2016-10-01 DIAGNOSIS — Z6841 Body Mass Index (BMI) 40.0 and over, adult: Secondary | ICD-10-CM | POA: Diagnosis not present

## 2016-10-01 DIAGNOSIS — IMO0001 Reserved for inherently not codable concepts without codable children: Secondary | ICD-10-CM

## 2016-10-01 DIAGNOSIS — Z125 Encounter for screening for malignant neoplasm of prostate: Secondary | ICD-10-CM | POA: Diagnosis not present

## 2016-10-01 DIAGNOSIS — Z0000 Encounter for general adult medical examination without abnormal findings: Secondary | ICD-10-CM | POA: Diagnosis not present

## 2016-10-01 LAB — CBC WITH DIFFERENTIAL/PLATELET
BASOS ABS: 74 {cells}/uL (ref 0–200)
Basophils Relative: 1 %
EOS PCT: 3 %
Eosinophils Absolute: 222 cells/uL (ref 15–500)
HCT: 43.1 % (ref 38.5–50.0)
Hemoglobin: 13.9 g/dL (ref 13.0–17.0)
Lymphocytes Relative: 56 %
Lymphs Abs: 4144 cells/uL — ABNORMAL HIGH (ref 850–3900)
MCH: 28.3 pg (ref 27.0–33.0)
MCHC: 32.3 g/dL (ref 32.0–36.0)
MCV: 87.8 fL (ref 80.0–100.0)
MONOS PCT: 11 %
MPV: 9.2 fL (ref 7.5–12.5)
Monocytes Absolute: 814 cells/uL (ref 200–950)
NEUTROS PCT: 29 %
Neutro Abs: 2146 cells/uL (ref 1500–7800)
PLATELETS: 336 10*3/uL (ref 140–400)
RBC: 4.91 MIL/uL (ref 4.20–5.80)
RDW: 14.4 % (ref 11.0–15.0)
WBC: 7.4 10*3/uL (ref 3.8–10.8)

## 2016-10-01 LAB — COMPREHENSIVE METABOLIC PANEL
ALBUMIN: 4.4 g/dL (ref 3.6–5.1)
ALT: 26 U/L (ref 9–46)
AST: 20 U/L (ref 10–40)
Alkaline Phosphatase: 42 U/L (ref 40–115)
BUN: 18 mg/dL (ref 7–25)
CHLORIDE: 104 mmol/L (ref 98–110)
CO2: 29 mmol/L (ref 20–31)
CREATININE: 1.16 mg/dL (ref 0.60–1.35)
Calcium: 10 mg/dL (ref 8.6–10.3)
Glucose, Bld: 95 mg/dL (ref 70–99)
POTASSIUM: 4.8 mmol/L (ref 3.5–5.3)
SODIUM: 140 mmol/L (ref 135–146)
Total Bilirubin: 0.3 mg/dL (ref 0.2–1.2)
Total Protein: 7.4 g/dL (ref 6.1–8.1)

## 2016-10-01 LAB — LIPID PANEL
CHOL/HDL RATIO: 2.8 ratio (ref <=5.0)
CHOLESTEROL: 105 mg/dL — AB (ref 125–200)
HDL: 38 mg/dL — ABNORMAL LOW (ref >=40)
LDL CALC: 58 mg/dL (ref <130)
Triglycerides: 43 mg/dL (ref <150)
VLDL: 9 mg/dL (ref <30)

## 2016-10-01 LAB — PSA: PSA: 2.5 ng/mL (ref <=4.0)

## 2016-10-01 MED FILL — PRAVASTATIN NA 40 MG TAB: 40 | 90 days supply | Qty: 90 | Fill #1

## 2016-10-01 MED FILL — AMLODIPINE BESYLATE 10 MG T: 10 | 90 days supply | Qty: 90 | Fill #1

## 2016-10-01 NOTE — Assessment & Plan Note (Signed)
CONTROLLED, no changes

## 2016-10-01 NOTE — Addendum Note (Signed)
Addended by: Phineas SemenJOHNSON, Onika Gudiel A on: 10/01/2016 11:00 AM   Modules accepted: Orders

## 2016-10-01 NOTE — Assessment & Plan Note (Signed)
Currently diet controlled, if A1C > 6.5%, start Metformin

## 2016-10-01 NOTE — Progress Notes (Signed)
   Subjective:    Patient ID: James Murphy, male    DOB: [birth year], 50 y.o.   MRN: [id number]  Patient presents for Annual Exam (PHQ 0)  Patient here for complete physical exam Diabetes mellitus currently diet controlled his last A1c however with up to 6.4% here for recheck today, he has gained 3lbs, has not been exercising, has not changed his diet.   He has taken his blood pressure medicine as prescribed Due for flu shot Other immunizations up-to-date He has had some allergy problems with change in weather ,mostly nasal drainage   Review Of Systems:  GEN- denies fatigue, fever, weight loss,weakness, recent illness HEENT- denies eye drainage, change in vision, +nasal discharge, CVS- denies chest pain, palpitations RESP- denies SOB, cough, wheeze ABD- denies N/V, change in stools, abd pain GU- denies dysuria, hematuria, dribbling, incontinence MSK- denies joint pain, muscle aches, injury Neuro- denies headache, dizziness, syncope, seizure activity       Objective:    BP 124/82   Pulse 86   Temp 97.5 F (36.4 C) (Oral)   Resp 16   Wt 258 lb (117 kg)   SpO2 98%   BMI 38.10 kg/m  GEN- NAD, alert and oriented x3 HEENT- PERRL, EOMI, non injected sclera, pink conjunctiva, MMM, oropharynx clear,clear rhinorrhea Neck- Supple, no thyromegaly CVS- RRR, soft systolic murmur RESP-CTAB ABD-NABS,soft,NT,ND EXT- No edema Pulses- Radial, DP- 2+        Assessment & Plan:      Problem List Items Addressed This Visit    Hypertension    CONTROLLED, no changes       Hyperlipidemia   Relevant Orders   Lipid panel   Diabetes (HCC)    Currently diet controlled, if A1C > 6.5%, start Metformin       Relevant Orders   Hemoglobin A1c    Other Visit Diagnoses    Routine general medical examination at a health care facility    -  Primary   CPE done, Flu shot done, fasting labs, discussed weight, need for at least 50-6lb lost to reduce risk factors of CAD/Stroke, complications  OF dm   Relevant Orders   CBC with Differential/Platelet   Comprehensive metabolic panel   Lipid panel   Prostate cancer screening       Relevant Orders   PSA      Note: This dictation was prepared with Dragon dictation along with smaller phrase technology. Any transcriptional errors that result from this process are unintentional.

## 2016-10-01 NOTE — Patient Instructions (Signed)
F/U 4 months  I recommend eye visit once a year I recommend dental visit every 6 months Goal is to  Exercise 30 minutes 5 days a week We will send a letter with lab results    

## 2016-10-02 LAB — HEMOGLOBIN A1C
Hgb A1c MFr Bld: 6.1 % — ABNORMAL HIGH (ref <5.7)
MEAN PLASMA GLUCOSE: 128 mg/dL

## 2016-10-03 ENCOUNTER — Encounter: Payer: Self-pay | Admitting: *Deleted

## 2016-10-11 DIAGNOSIS — H5203 Hypermetropia, bilateral: Secondary | ICD-10-CM | POA: Diagnosis not present

## 2016-10-11 DIAGNOSIS — H52223 Regular astigmatism, bilateral: Secondary | ICD-10-CM | POA: Diagnosis not present

## 2016-10-11 DIAGNOSIS — H524 Presbyopia: Secondary | ICD-10-CM | POA: Diagnosis not present

## 2016-11-01 MED FILL — BENAZEPRIL HCL 40 MG TABLET: 40 | 90 days supply | Qty: 90 | Fill #1

## 2016-11-25 ENCOUNTER — Telehealth: Payer: Self-pay | Admitting: Family Medicine

## 2016-11-25 MED ORDER — AMLODIPINE BESYLATE 10 MG PO TABS: 10.0000 mg | ORAL_TABLET | Freq: Every day | ORAL | 1 refills | Status: DC

## 2016-11-25 MED ORDER — PRAVASTATIN SODIUM 40 MG PO TABS: 40.0000 mg | ORAL_TABLET | Freq: Every day | ORAL | 3 refills | Status: DC

## 2016-11-25 MED ORDER — BENAZEPRIL HCL 40 MG PO TABS: 40.0000 mg | ORAL_TABLET | Freq: Every day | ORAL | 1 refills | Status: DC

## 2016-11-25 NOTE — Telephone Encounter (Signed)
Prescriptions sent to pharmacy.   Call placed to patient. Advised that insurance may not pay for prescription if it is not due yet. Advised that if this is the case, he may have to pay out of pocket.

## 2016-11-25 NOTE — Telephone Encounter (Signed)
Patient needs a refill on all of his medication [place] and all of his medication got thrown away in the move.  He would like these called into [facility]. He has been without of medication now for little over a week.   CB# [phone]

## 2016-11-26 MED FILL — BENAZEPRIL HCL 40 MG TABLET: 40 | 30 days supply | Qty: 30 | Fill #0

## 2016-11-26 MED FILL — PRAVASTATIN NA 40 MG TAB: 40 | 30 days supply | Qty: 30 | Fill #0

## 2016-11-26 MED FILL — AMLODIPINE BESYLATE 10 MG T: 10 | 30 days supply | Qty: 30 | Fill #0

## 2017-01-27 MED FILL — AMLODIPINE BESYLATE 10 MG T: 10 | 90 days supply | Qty: 90 | Fill #1

## 2017-01-27 MED FILL — BENAZEPRIL HCL 40 MG TABLET: 40 | 90 days supply | Qty: 90 | Fill #1

## 2017-01-27 MED FILL — PRAVASTATIN NA 40 MG TAB: 40 | 90 days supply | Qty: 90 | Fill #1

## 2017-02-03 ENCOUNTER — Ambulatory Visit: Payer: 59 | Admitting: Family Medicine

## 2017-03-20 MED FILL — LEVOCETIRIZINE 5 MG TABLET: 5 | 30 days supply | Qty: 30 | Fill #1

## 2017-05-21 MED FILL — BENAZEPRIL HCL 40 MG TABLET: 40 | 60 days supply | Qty: 60 | Fill #2

## 2017-05-21 MED FILL — AMLODIPINE BESYLATE 10 MG T: 10 | 60 days supply | Qty: 60 | Fill #2

## 2017-05-21 MED FILL — PRAVASTATIN NA 40 MG TAB: 40 | 90 days supply | Qty: 90 | Fill #2

## 2017-08-20 ENCOUNTER — Other Ambulatory Visit: Payer: Self-pay | Admitting: Family Medicine

## 2017-08-20 MED FILL — BENAZEPRIL HCL 40 MG TABLET: 40 | 90 days supply | Qty: 90 | Fill #0

## 2017-08-20 MED FILL — AMLODIPINE BESYLATE 10 MG T: 10 | 90 days supply | Qty: 90 | Fill #0

## 2017-08-20 MED FILL — PRAVASTATIN NA 40 MG TAB: 40 | 90 days supply | Qty: 90 | Fill #3

## 2017-11-10 MED FILL — PRAVASTATIN NA 40 MG TAB: 40 | 60 days supply | Qty: 60 | Fill #4

## 2017-11-10 MED FILL — AMLODIPINE BESYLATE 10 MG T: 10 | 90 days supply | Qty: 90 | Fill #1

## 2017-11-10 MED FILL — BENAZEPRIL HCL 40 MG TABLET: 40 | 90 days supply | Qty: 90 | Fill #1

## 2017-12-01 ENCOUNTER — Ambulatory Visit: Payer: 59 | Admitting: Family Medicine

## 2017-12-11 ENCOUNTER — Ambulatory Visit (INDEPENDENT_AMBULATORY_CARE_PROVIDER_SITE_OTHER): Payer: 59 | Admitting: Family Medicine

## 2017-12-11 ENCOUNTER — Other Ambulatory Visit: Payer: Self-pay

## 2017-12-11 ENCOUNTER — Encounter: Payer: Self-pay | Admitting: Family Medicine

## 2017-12-11 VITALS — BP 124/84 | HR 80 | Temp 98.2°F | Resp 18 | Ht 69.0 in | Wt 276.4 lb

## 2017-12-11 DIAGNOSIS — Z0000 Encounter for general adult medical examination without abnormal findings: Secondary | ICD-10-CM

## 2017-12-11 DIAGNOSIS — I10 Essential (primary) hypertension: Secondary | ICD-10-CM

## 2017-12-11 DIAGNOSIS — Z125 Encounter for screening for malignant neoplasm of prostate: Secondary | ICD-10-CM

## 2017-12-11 DIAGNOSIS — E7800 Pure hypercholesterolemia, unspecified: Secondary | ICD-10-CM

## 2017-12-11 DIAGNOSIS — E119 Type 2 diabetes mellitus without complications: Secondary | ICD-10-CM

## 2017-12-11 DIAGNOSIS — Z6841 Body Mass Index (BMI) 40.0 and over, adult: Secondary | ICD-10-CM | POA: Diagnosis not present

## 2017-12-11 DIAGNOSIS — Z23 Encounter for immunization: Secondary | ICD-10-CM

## 2017-12-11 NOTE — Progress Notes (Signed)
   Subjective:    Patient ID: James Murphy, male    DOB: 21-Jan-1966, 51 y.o.   MRN: [id number]  Patient presents for Annual Exam (is fasting) and Flu Vaccine   Pt here to for physical exam. Has gained 20lbs since last physical, states he was 280lbs. He started to cut   Gets heartburn has zantac that he uses as needed, he noticed it more when he started eating more, not watching his diet.    DM- diet controlled previously, A1C was  6.1%in Oct   Hyperlipidemia- on statin drug  HTN- taking BPmeds as prescribed       Eye docotr- [place] Eye doctor in [place], he is due for appointment   Review Of Systems:  GEN- denies fatigue, fever, weight loss,weakness, recent illness HEENT- denies eye drainage, change in vision, nasal discharge, CVS- denies chest pain, palpitations RESP- denies SOB, cough, wheeze ABD- denies N/V, change in stools, abd pain GU- denies dysuria, hematuria, dribbling, incontinence MSK- denies joint pain, muscle aches, injury Neuro- denies headache, dizziness, syncope, seizure activity       Objective:    BP 124/84 (BP Location: Right Arm, Patient Position: Sitting, Cuff Size: Large)   Pulse 80   Temp 98.2 F (36.8 C) (Oral)   Resp 18   Ht 5\' 9"  (1.753 m)   Wt 276 lb 6.4 oz (125.4 kg)   BMI 40.82 kg/m  GEN- NAD, alert and oriented x3 HEENT- PERRL, EOMI, non injected sclera, pink conjunctiva, MMM, oropharynx clear Neck- Supple, no thyromegaly CVS- RRR, no murmur RESP-CTAB ABD-NABS,soft,NT,ND EXT- No edema Pulses- Radial, DP- 2+        Assessment & Plan:      Problem List Items Addressed This Visit      Unprioritized   Hyperlipidemia   Relevant Orders   Lipid panel   Obesity   Hypertension   Relevant Orders   CBC with Differential/Platelet   Comprehensive metabolic panel   Diabetes (HCC)    I am concerned with his weight gain that his diabetes is now uncontrolled.  His blood pressure actually looks good and I will not change medication.  We will  check his fasting labs including a lipid panel and PSA for his complete physical exam.    He was given his flu shot today.  We will schedule visit with the ophthalmologist.  Was unable to leave a urine sample for urine micro today.  We discussed his obesity cutting out the fast.  The soda and getting back on track with his diet.  He needs to lose at least 50 pounds.      Relevant Orders   Hemoglobin A1c   Lipid panel   HM DIABETES FOOT EXAM (Completed)    Other Visit Diagnoses    Need for prophylactic vaccination and inoculation against influenza    -  Primary   Relevant Orders   Flu Vaccine QUAD 6+ mos PF IM (Fluarix Quad PF) (Completed)   Routine general medical examination at a health care facility       Relevant Orders   CBC with Differential/Platelet   Comprehensive metabolic panel   Prostate cancer screening       Relevant Orders   PSA      Note: This dictation was prepared with Dragon dictation along with smaller phrase technology. Any transcriptional errors that result from this process are unintentional.

## 2017-12-11 NOTE — Assessment & Plan Note (Signed)
I am concerned with his weight gain that his diabetes is now uncontrolled.  His blood pressure actually looks good and I will not change medication.  We will check his fasting labs including a lipid panel and PSA for his complete physical exam.    He was given his flu shot today.  We will schedule visit with the ophthalmologist.  Was unable to leave a urine sample for urine micro today.  We discussed his obesity cutting out the fast.  The soda and getting back on track with his diet.  He needs to lose at least 50 pounds.

## 2017-12-11 NOTE — Patient Instructions (Signed)
F/u 6 MONTHS  I recommend eye visit once a year I recommend dental visit every 6 months Goal is to  Exercise 30 minutes 5 days a week We will CALL with lab results

## 2017-12-12 LAB — CBC WITH DIFFERENTIAL/PLATELET
BASOS PCT: 0.7 %
Basophils Absolute: 47 cells/uL (ref 0–200)
EOS ABS: 161 {cells}/uL (ref 15–500)
EOS PCT: 2.4 %
HCT: 41.4 % (ref 38.5–50.0)
HEMOGLOBIN: 13.8 g/dL (ref 13.2–17.1)
Lymphs Abs: 4007 cells/uL — ABNORMAL HIGH (ref 850–3900)
MCH: 28.9 pg (ref 27.0–33.0)
MCHC: 33.3 g/dL (ref 32.0–36.0)
MCV: 86.8 fL (ref 80.0–100.0)
MONOS PCT: 10.6 %
MPV: 10.2 fL (ref 7.5–12.5)
NEUTROS ABS: 1776 {cells}/uL (ref 1500–7800)
Neutrophils Relative %: 26.5 %
Platelets: 318 10*3/uL (ref 140–400)
RBC: 4.77 10*6/uL (ref 4.20–5.80)
RDW: 13 % (ref 11.0–15.0)
Total Lymphocyte: 59.8 %
WBC mixed population: 710 cells/uL (ref 200–950)
WBC: 6.7 10*3/uL (ref 3.8–10.8)

## 2017-12-12 LAB — LIPID PANEL
CHOL/HDL RATIO: 3.4 (calc) (ref <5.0)
CHOLESTEROL: 127 mg/dL (ref <200)
HDL: 37 mg/dL — AB (ref >40)
LDL CHOLESTEROL (CALC): 76 mg/dL
Non-HDL Cholesterol (Calc): 90 mg/dL (calc) (ref <130)
Triglycerides: 59 mg/dL (ref <150)

## 2017-12-12 LAB — COMPREHENSIVE METABOLIC PANEL
AG Ratio: 1.7 (calc) (ref 1.0–2.5)
ALKALINE PHOSPHATASE (APISO): 47 U/L (ref 40–115)
ALT: 24 U/L (ref 9–46)
AST: 18 U/L (ref 10–40)
Albumin: 4.5 g/dL (ref 3.6–5.1)
BUN: 21 mg/dL (ref 7–25)
CO2: 28 mmol/L (ref 20–32)
CREATININE: 1.25 mg/dL (ref 0.60–1.35)
Calcium: 9.6 mg/dL (ref 8.6–10.3)
Chloride: 103 mmol/L (ref 98–110)
Globulin: 2.7 g/dL (calc) (ref 1.9–3.7)
Glucose, Bld: 100 mg/dL — ABNORMAL HIGH (ref 65–99)
Potassium: 4.3 mmol/L (ref 3.5–5.3)
Sodium: 138 mmol/L (ref 135–146)
Total Bilirubin: 0.5 mg/dL (ref 0.2–1.2)
Total Protein: 7.2 g/dL (ref 6.1–8.1)

## 2017-12-12 LAB — HEMOGLOBIN A1C
EAG (MMOL/L): 7.4 (calc)
Hgb A1c MFr Bld: 6.3 % of total Hgb — ABNORMAL HIGH (ref <5.7)
MEAN PLASMA GLUCOSE: 134 (calc)

## 2017-12-12 LAB — PSA: PSA: 2.9 ng/mL (ref <=4.0)

## 2017-12-15 ENCOUNTER — Encounter: Payer: Self-pay | Admitting: *Deleted

## 2018-02-16 ENCOUNTER — Other Ambulatory Visit: Payer: Self-pay | Admitting: Family Medicine

## 2018-02-16 MED FILL — PRAVASTATIN NA 40 MG TAB: 40 | 90 days supply | Qty: 90 | Fill #0

## 2018-02-16 MED FILL — AMLODIPINE BESYLATE 10 MG T: 10 | 90 days supply | Qty: 90 | Fill #0

## 2018-02-16 MED FILL — BENAZEPRIL HCL 40 MG TABLET: 40 | 90 days supply | Qty: 90 | Fill #0

## 2018-03-16 ENCOUNTER — Encounter: Payer: Self-pay | Admitting: Physician Assistant

## 2018-03-16 ENCOUNTER — Ambulatory Visit (INDEPENDENT_AMBULATORY_CARE_PROVIDER_SITE_OTHER): Payer: 59 | Admitting: Physician Assistant

## 2018-03-16 ENCOUNTER — Other Ambulatory Visit: Payer: Self-pay

## 2018-03-16 VITALS — BP 124/88 | HR 103 | Temp 98.5°F | Resp 18 | Wt 282.4 lb

## 2018-03-16 DIAGNOSIS — J988 Other specified respiratory disorders: Secondary | ICD-10-CM | POA: Diagnosis not present

## 2018-03-16 DIAGNOSIS — B9689 Other specified bacterial agents as the cause of diseases classified elsewhere: Principal | ICD-10-CM

## 2018-03-16 MED ORDER — AZITHROMYCIN 250 MG PO TABS: ORAL_TABLET | ORAL | 0 refills | Status: DC

## 2018-03-16 MED ORDER — FLUTICASONE PROPIONATE 50 MCG/ACT NA SUSP: 2.0000 | Freq: Every day | NASAL | 6 refills | Status: DC

## 2018-03-16 MED FILL — FLUTICASONE PROP 50 MCG SPR: 50 | 30 days supply | Qty: 16 | Fill #0

## 2018-03-16 MED FILL — AZITHROMYCIN 250 MG TABLET: 250 | 5 days supply | Qty: 6 | Fill #0

## 2018-03-16 NOTE — Progress Notes (Signed)
    Patient ID: James Murphy MRN: [id number], DOB: February 23, 1966, 52 y.o. Date of Encounter: 03/16/2018, 12:07 PM    Chief Complaint:  Chief Complaint  Patient presents with  . chest congestion    x1 week  . pressure in face     HPI: 52 y.o. year old male presents with above.   Reports that he has been having a lot of sinus congestion as well as chest congestion.  Says that he was having more pain and pressure in his sinus region a few days ago and was having to take Aleve for that pain but says that those symptoms have gotten some better.  Says that he is getting out thick dark mucus as well as "green phlegm".  Has had no known fever.  No significant sore throat.  No other symptoms or concerns.     Home Meds:   Outpatient Medications Prior to Visit  Medication Sig Dispense Refill  . amLODipine (NORVASC) 10 MG tablet TAKE 1 TABLET BY MOUTH DAILY. 90 tablet 1  . aspirin 81 MG tablet Take 81 mg by mouth daily.    . benazepril (LOTENSIN) 40 MG tablet TAKE 1 TABLET BY MOUTH DAILY. 90 tablet 1  . pravastatin (PRAVACHOL) 40 MG tablet TAKE 1 TABLET BY MOUTH DAILY. 90 tablet 3   No facility-administered medications prior to visit.     Allergies: No Known Allergies    Review of Systems: See HPI for pertinent ROS. All other ROS negative.    Physical Exam: Blood pressure 124/88, pulse (!) 103, temperature 98.5 F (36.9 C), temperature source Oral, resp. rate 18, weight 128.1 kg (282 lb 6.4 oz), SpO2 95 %., Body mass index is 41.7 kg/m. General: Obese AAM.  Appears in no acute distress. HEENT: Normocephalic, atraumatic, eyes without discharge, sclera non-icteric, nares are without discharge. Bilateral auditory canals clear, TM's are without perforation, pearly grey and translucent with reflective cone of light bilaterally. Oral cavity moist, posterior pharynx without exudate, erythema, peritonsillar abscess.  No tenderness with percussion to frontal or maxillary sinuses bilaterally.  Neck:  Supple. No thyromegaly. No lymphadenopathy. Lungs: Clear bilaterally to auscultation without wheezes, rales, or rhonchi. Breathing is unlabored. Heart: Regular rhythm. No murmurs, rubs, or gallops. Msk:  Strength and tone normal for age. Extremities/Skin: Warm and dry. Neuro: Alert and oriented X 3. Moves all extremities spontaneously. Gait is normal. CNII-XII grossly in tact. Psych:  Responds to questions appropriately with a normal affect.     ASSESSMENT AND PLAN:  52 y.o. year old male with  1. Bacterial respiratory infection He is to take the antibiotic as directed.  Also can use the nasal spray to help with congestion symptoms.  Follow-up if symptoms do not resolve within 1 week after completion of antibiotic.  Offered note to be out of work but he states that he needs no note for work. - azithromycin (ZITHROMAX) 250 MG tablet; Day 1: Take 2 daily. Days 2 -5: take 1 daily.  Dispense: 6 tablet; Refill: 0 - fluticasone (FLONASE) 50 MCG/ACT nasal spray; Place 2 sprays into both nostrils daily.  Dispense: 16 g; Refill: [address]   Signed, Shaniquia Brafford Beth North[place]Dixon, [place], [facility] 03/16/2018 12:07 PM

## 2018-06-12 ENCOUNTER — Ambulatory Visit: Payer: 59 | Admitting: Family Medicine

## 2018-06-23 ENCOUNTER — Encounter: Payer: Self-pay | Admitting: Family Medicine

## 2018-07-06 ENCOUNTER — Encounter: Payer: Self-pay | Admitting: Family Medicine

## 2018-07-08 MED FILL — PRAVASTATIN NA 40 MG TAB: 40 | 90 days supply | Qty: 90 | Fill #1

## 2018-07-08 MED FILL — BENAZEPRIL HCL 40 MG TABLET: 40 | 90 days supply | Qty: 90 | Fill #1

## 2018-07-08 MED FILL — AMLODIPINE BESYLATE 10 MG T: 10 | 90 days supply | Qty: 90 | Fill #1

## 2018-09-04 ENCOUNTER — Encounter: Payer: Self-pay | Admitting: Physician Assistant

## 2018-09-04 ENCOUNTER — Ambulatory Visit: Payer: 59 | Admitting: Physician Assistant

## 2018-09-04 VITALS — BP 130/88 | HR 93 | Temp 98.3°F | Resp 18 | Ht 69.0 in | Wt 291.4 lb

## 2018-09-04 DIAGNOSIS — J309 Allergic rhinitis, unspecified: Secondary | ICD-10-CM

## 2018-09-04 DIAGNOSIS — H1010 Acute atopic conjunctivitis, unspecified eye: Secondary | ICD-10-CM

## 2018-09-04 DIAGNOSIS — J302 Other seasonal allergic rhinitis: Secondary | ICD-10-CM

## 2018-09-04 MED ORDER — AZELASTINE HCL 0.05 % OP SOLN: 1.0000 [drp] | Freq: Two times a day (BID) | OPHTHALMIC | 12 refills | Status: DC

## 2018-09-04 MED ORDER — FEXOFENADINE HCL 180 MG PO TABS: 180.0000 mg | ORAL_TABLET | Freq: Every day | ORAL | 5 refills | Status: DC

## 2018-09-04 MED ORDER — FLUTICASONE PROPIONATE 50 MCG/ACT NA SUSP: 2.0000 | Freq: Every day | NASAL | 6 refills | Status: DC

## 2018-09-04 MED ORDER — MONTELUKAST SODIUM 10 MG PO TABS: 10.0000 mg | ORAL_TABLET | Freq: Every day | ORAL | 2 refills | Status: DC

## 2018-09-04 MED FILL — AZELASTINE HCL 0.05% DROPS: 0.05 | 30 days supply | Qty: 6 | Fill #0

## 2018-09-04 MED FILL — MONTELUKAST SOD 10 MG TAB: 10 | 30 days supply | Qty: 30 | Fill #0

## 2018-09-04 MED FILL — FLUTICASONE PROP 50 MCG SPR: 50 | 30 days supply | Qty: 16 | Fill #0

## 2018-09-04 NOTE — Progress Notes (Signed)
Patient ID: James Murphy MRN: [id number], DOB: 04/17/1966, 52 y.o. Date of Encounter: 09/04/2018, 9:32 AM    Chief Complaint:  Chief Complaint  Patient presents with  . head congestion  . eyes itching     HPI: 52 y.o. year old male presents with above.   Says that his "nose has been running".   He is snorting constantly through entire visit.  Constantly snorting up drainage from his nose and throat.   States that at times his nose drips watery clear and other times it feels stopped up. Regarding his eyes-- he states that "they run "constantly all day and itch all day." He has had no golden crust on his eyelashes in the mornings.  He has had no thick mucousy drainage from the eyes. Has had no chest congestion.  He has used some Zyrtec and some Mucinex with no relief.     Home Meds:   Outpatient Medications Prior to Visit  Medication Sig Dispense Refill  . amLODipine (NORVASC) 10 MG tablet TAKE 1 TABLET BY MOUTH DAILY. 90 tablet 1  . aspirin 81 MG tablet Take 81 mg by mouth daily.    . benazepril (LOTENSIN) 40 MG tablet TAKE 1 TABLET BY MOUTH DAILY. 90 tablet 1  . pravastatin (PRAVACHOL) 40 MG tablet TAKE 1 TABLET BY MOUTH DAILY. 90 tablet 3  . fluticasone (FLONASE) 50 MCG/ACT nasal spray Place 2 sprays into both nostrils daily. (Patient not taking: Reported on 09/04/2018) 16 g 6  . azithromycin (ZITHROMAX) 250 MG tablet Day 1: Take 2 daily. Days 2 -5: take 1 daily. 6 tablet 0   No facility-administered medications prior to visit.     Allergies: No Known Allergies    Review of Systems: See HPI for pertinent ROS. All other ROS negative.    Physical Exam: Blood pressure 130/88, pulse 93, temperature 98.3 F (36.8 C), temperature source Oral, resp. rate 18, height 5\' 9"  (1.753 m), weight 132.2 kg, SpO2 96 %., Body mass index is 43.03 kg/m. General:  AAM. Appears in no acute distress. HEENT: Normocephalic, atraumatic, eyes with clear, watery drainage. No mucousy  drainage. Conjunctiva white, normal with no injection.  nares with erythema, edema, clear watery drainage.  Bilateral auditory canals clear, TM's are without perforation, pearly grey and translucent with reflective cone of light bilaterally. Oral cavity moist, posterior pharynx without exudate, erythema.  Neck: Supple. No thyromegaly. No lymphadenopathy. Lungs: Clear bilaterally to auscultation without wheezes, rales, or rhonchi. Breathing is unlabored. Heart: Regular rhythm. No murmurs, rubs, or gallops. Msk:  Strength and tone normal for age. Extremities/Skin: Warm and dry.  Neuro: Alert and oriented X 3. Moves all extremities spontaneously. Gait is normal. CNII-XII grossly in tact. Psych:  Responds to questions appropriately with a normal affect.     ASSESSMENT AND PLAN:  52 y.o. year old male with   1. Allergic rhinoconjunctivitis Take the oral Allegra and Singulair to help dry up drainage and other symptoms. Can use the Flonase as needed for the nasal congestion symptoms. Use the Optivar as needed for the itchy watery eyes symptoms. Follow up if needed. - fexofenadine (ALLEGRA ALLERGY) 180 MG tablet; Take 1 tablet (180 mg total) by mouth daily.  Dispense: 30 tablet; Refill: 5 - montelukast (SINGULAIR) 10 MG tablet; Take 1 tablet (10 mg total) by mouth at bedtime.  Dispense: 30 tablet; Refill: 2 - fluticasone (FLONASE) 50 MCG/ACT nasal spray; Place 2 sprays into both nostrils daily.  Dispense: 16 g; Refill: 6 - azelastine (  OPTIVAR) 0.05 % ophthalmic solution; Place 1 drop into both eyes 2 (two) times daily.  Dispense: 6 mL; Refill: 12  2. Seasonal allergies  - fexofenadine (ALLEGRA ALLERGY) 180 MG tablet; Take 1 tablet (180 mg total) by mouth daily.  Dispense: 30 tablet; Refill: 5 - montelukast (SINGULAIR) 10 MG tablet; Take 1 tablet (10 mg total) by mouth at bedtime.  Dispense: 30 tablet; Refill: 2 - fluticasone (FLONASE) 50 MCG/ACT nasal spray; Place 2 sprays into both nostrils  daily.  Dispense: 16 g; Refill: 6 - azelastine (OPTIVAR) 0.05 % ophthalmic solution; Place 1 drop into both eyes 2 (two) times daily.  Dispense: 6 mL; Refill: [address]   Signed, Norton Bivins Beth Brewster HillDixon, [place], [facility] 09/04/2018 9:32 AM

## 2018-12-22 ENCOUNTER — Encounter: Payer: Self-pay | Admitting: Family Medicine

## 2018-12-22 ENCOUNTER — Other Ambulatory Visit: Payer: Self-pay

## 2018-12-22 ENCOUNTER — Ambulatory Visit: Payer: 59 | Admitting: Family Medicine

## 2018-12-22 VITALS — BP 140/88 | HR 84 | Temp 98.6°F | Resp 16 | Ht 69.0 in | Wt 288.0 lb

## 2018-12-22 DIAGNOSIS — G4730 Sleep apnea, unspecified: Secondary | ICD-10-CM | POA: Diagnosis not present

## 2018-12-22 DIAGNOSIS — H5203 Hypermetropia, bilateral: Secondary | ICD-10-CM | POA: Diagnosis not present

## 2018-12-22 DIAGNOSIS — H52223 Regular astigmatism, bilateral: Secondary | ICD-10-CM | POA: Diagnosis not present

## 2018-12-22 DIAGNOSIS — E119 Type 2 diabetes mellitus without complications: Secondary | ICD-10-CM | POA: Diagnosis not present

## 2018-12-22 DIAGNOSIS — E7800 Pure hypercholesterolemia, unspecified: Secondary | ICD-10-CM | POA: Diagnosis not present

## 2018-12-22 DIAGNOSIS — H524 Presbyopia: Secondary | ICD-10-CM | POA: Diagnosis not present

## 2018-12-22 DIAGNOSIS — Z23 Encounter for immunization: Secondary | ICD-10-CM | POA: Diagnosis not present

## 2018-12-22 DIAGNOSIS — Z6841 Body Mass Index (BMI) 40.0 and over, adult: Secondary | ICD-10-CM

## 2018-12-22 DIAGNOSIS — Z114 Encounter for screening for human immunodeficiency virus [HIV]: Secondary | ICD-10-CM

## 2018-12-22 DIAGNOSIS — Z125 Encounter for screening for malignant neoplasm of prostate: Secondary | ICD-10-CM

## 2018-12-22 DIAGNOSIS — Z0000 Encounter for general adult medical examination without abnormal findings: Secondary | ICD-10-CM | POA: Diagnosis not present

## 2018-12-22 DIAGNOSIS — I10 Essential (primary) hypertension: Secondary | ICD-10-CM | POA: Diagnosis not present

## 2018-12-22 NOTE — Assessment & Plan Note (Signed)
Diabetes has been controlled recheck his A1c.  He has gained weight over the past year we discussed dietary changes and need for weight loss.  There is also concern for probable sleep apnea.

## 2018-12-22 NOTE — Assessment & Plan Note (Signed)
Pressure is elevated today however he has not had his medications.  He states that it is typically controlled when he checks.

## 2018-12-22 NOTE — Patient Instructions (Signed)
Sleep study to be done We will call with labs results Flu done Call and set up cologuard  F/U 6 months

## 2018-12-22 NOTE — Progress Notes (Signed)
Subjective:    Patient ID: James Murphy, male    DOB: 1966/03/29, 52 y.o.   MRN: [id number]  Patient presents for Annual Exam (is fasting)   Pt here for CPE     Doing okay     Hyperlipidemia- pravastatin      htn- Taking norvasc and benezapril fo rblood pressures     He gained 12 pounds over the past year.  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  States that he snores all the time, wife is told him that he stops breathing in his sleep.  He does wake up in the middle the night trying to catch his breath.  He also feels like he has not slept at all when he gets up in the morning.  Times his breathing is noisy when he is awake.   [place]    DM- diet controlled    Review Of Systems:  GEN- denies fatigue, fever, weight loss,weakness, recent illness HEENT- denies eye drainage, change in vision, nasal discharge, CVS- denies chest pain, palpitations RESP- denies SOB, cough, wheeze ABD- denies N/V, change in stools, abd pain GU- denies dysuria, hematuria, dribbling, incontinence MSK- denies joint pain, muscle aches, injury Neuro- denies headache, dizziness, syncope, seizure activity       Objective:    BP 140/88   Pulse 84   Temp 98.6 F (37 C) (Oral)   Resp 16   Ht 5\' 9"  (1.753 m)   Wt 288 lb (130.6 kg)   SpO2 98%   BMI 42.53 kg/m  GEN- NAD, alert and oriented x3,obese  HEENT- PERRL, EOMI, non injected sclera, pink conjunctiva, MMM, oropharynx clear Neck- Supple, no thyromegaly CVS- RRR, no murmur RESP-CTAB ABD-NABS,soft,NT,ND EXT- No edema Pulses- Radial, DP- 2+        Assessment & Plan:      Problem List Items Addressed This Visit      Unprioritized   Diabetes (HCC)    Diabetes has been controlled recheck his A1c.  He has gained weight over the past year we discussed dietary changes and need for weight loss.  There  is also concern for probable sleep apnea.      Relevant Orders   CBC with Differential/Platelet   COMPLETE METABOLIC PANEL WITH GFR   Hemoglobin A1c   Lipid panel   PSA   Microalbumin / creatinine urine ratio   HM DIABETES FOOT EXAM (Completed)   Hyperlipidemia   Relevant Orders   CBC with Differential/Platelet   COMPLETE METABOLIC PANEL WITH GFR   Hemoglobin A1c   Lipid panel   PSA   Microalbumin / creatinine urine ratio   Hypertension    Pressure is elevated today however he has not had his medications.  He states that it is typically controlled when he checks.      Relevant Orders   CBC with Differential/Platelet   COMPLETE METABOLIC PANEL WITH GFR   Hemoglobin A1c   Lipid panel   PSA   Microalbumin / creatinine urine ratio   Obesity    Other Visit Diagnoses    Routine general medical examination at a health care facility    -  Primary   CPE done, diet controlled DM , cologuard for colon cancer screening- given info to set up   Relevant Orders   CBC with Differential/Platelet  COMPLETE METABOLIC PANEL WITH GFR   Hemoglobin A1c   Lipid panel   PSA   Microalbumin / creatinine urine ratio   Prostate cancer screening       Relevant Orders   CBC with Differential/Platelet   COMPLETE METABOLIC PANEL WITH GFR   Hemoglobin A1c   Lipid panel   PSA   Microalbumin / creatinine urine ratio   Encounter for screening for HIV       Relevant Orders   HIV Antibody (routine testing w rflx)   Need for immunization against influenza       Relevant Orders   Flu Vaccine QUAD 36+ mos IM (Completed)   Sleep apnea, unspecified type       in setting of obesity, set up with sleep doctor for study   Relevant Orders   Ambulatory referral to Neurology      Note: This dictation was prepared with Dragon dictation along with smaller phrase technology. Any transcriptional errors that result from this process are unintentional.

## 2018-12-23 LAB — COMPLETE METABOLIC PANEL WITH GFR
AG Ratio: 1.4 (calc) (ref 1.0–2.5)
ALBUMIN MSPROF: 4.4 g/dL (ref 3.6–5.1)
ALKALINE PHOSPHATASE (APISO): 55 U/L (ref 40–115)
ALT: 20 U/L (ref 9–46)
AST: 17 U/L (ref 10–35)
BUN: 22 mg/dL (ref 7–25)
CHLORIDE: 103 mmol/L (ref 98–110)
CO2: 25 mmol/L (ref 20–32)
Calcium: 9.8 mg/dL (ref 8.6–10.3)
Creat: 1.18 mg/dL (ref 0.70–1.33)
GFR, EST AFRICAN AMERICAN: 83 mL/min/{1.73_m2} (ref >=60)
GFR, Est Non African American: 72 mL/min/{1.73_m2} (ref >=60)
GLOBULIN: 3.1 g/dL (ref 1.9–3.7)
GLUCOSE: 114 mg/dL — AB (ref 65–99)
Potassium: 4.4 mmol/L (ref 3.5–5.3)
SODIUM: 140 mmol/L (ref 135–146)
TOTAL PROTEIN: 7.5 g/dL (ref 6.1–8.1)
Total Bilirubin: 0.3 mg/dL (ref 0.2–1.2)

## 2018-12-23 LAB — LIPID PANEL
CHOLESTEROL: 138 mg/dL (ref <200)
HDL: 40 mg/dL — ABNORMAL LOW (ref >40)
LDL CHOLESTEROL (CALC): 84 mg/dL
Non-HDL Cholesterol (Calc): 98 mg/dL (calc) (ref <130)
Total CHOL/HDL Ratio: 3.5 (calc) (ref <5.0)
Triglycerides: 60 mg/dL (ref <150)

## 2018-12-23 LAB — HEMOGLOBIN A1C
HEMOGLOBIN A1C: 6.7 %{Hb} — AB (ref <5.7)
Mean Plasma Glucose: 146 (calc)
eAG (mmol/L): 8.1 (calc)

## 2018-12-23 LAB — CBC WITH DIFFERENTIAL/PLATELET
ABSOLUTE MONOCYTES: 702 {cells}/uL (ref 200–950)
BASOS ABS: 72 {cells}/uL (ref 0–200)
BASOS PCT: 0.8 %
Eosinophils Absolute: 252 cells/uL (ref 15–500)
Eosinophils Relative: 2.8 %
HEMATOCRIT: 41.6 % (ref 38.5–50.0)
HEMOGLOBIN: 14.2 g/dL (ref 13.2–17.1)
LYMPHS ABS: 5121 {cells}/uL — AB (ref 850–3900)
MCH: 30 pg (ref 27.0–33.0)
MCHC: 34.1 g/dL (ref 32.0–36.0)
MCV: 87.9 fL (ref 80.0–100.0)
MONOS PCT: 7.8 %
MPV: 9.9 fL (ref 7.5–12.5)
NEUTROS ABS: 2853 {cells}/uL (ref 1500–7800)
Neutrophils Relative %: 31.7 %
Platelets: 321 10*3/uL (ref 140–400)
RBC: 4.73 10*6/uL (ref 4.20–5.80)
RDW: 13.5 % (ref 11.0–15.0)
Total Lymphocyte: 56.9 %
WBC: 9 10*3/uL (ref 3.8–10.8)

## 2018-12-23 LAB — PSA: PSA: 2.7 ng/mL (ref <=4.0)

## 2018-12-23 LAB — MICROALBUMIN / CREATININE URINE RATIO
CREATININE, URINE: 117 mg/dL (ref 20–320)
MICROALB UR: 0.5 mg/dL
MICROALB/CREAT RATIO: 4 ug/mg{creat} (ref <30)

## 2018-12-24 ENCOUNTER — Telehealth: Payer: Self-pay | Admitting: *Deleted

## 2018-12-24 LAB — HIV ANTIBODY (ROUTINE TESTING W REFLEX): HIV 1&2 Ab, 4th Generation: NONREACTIVE

## 2018-12-24 NOTE — Telephone Encounter (Signed)
Received verbal orders for Cologuard.   Order placed via [employer].   Cologuard (Order [phone])

## 2019-01-07 ENCOUNTER — Encounter: Payer: Self-pay | Admitting: *Deleted

## 2019-01-07 MED ORDER — BLOOD GLUCOSE SYSTEM PAK KIT: PACK | 1 refills | Status: AC

## 2019-01-07 MED ORDER — BLOOD GLUCOSE TEST VI STRP: ORAL_STRIP | 11 refills | Status: AC

## 2019-01-07 MED ORDER — LANCETS MISC: 11 refills | Status: AC

## 2019-01-07 MED ORDER — METFORMIN HCL 500 MG PO TABS: 500.0000 mg | ORAL_TABLET | Freq: Every day | ORAL | 3 refills | Status: DC

## 2019-01-07 MED FILL — FREESTYLE LITE TEST STRIP: 90 days supply | Qty: 100 | Fill #0

## 2019-01-07 MED FILL — metFORMIN HCL 500 MG TABS: 500 | 90 days supply | Qty: 90 | Fill #0

## 2019-01-07 MED FILL — FREESTYLE LITE METER: 30 days supply | Qty: 1 | Fill #0

## 2019-01-07 MED FILL — FREESTYLE LANCETS: 90 days supply | Qty: 100 | Fill #0

## 2019-01-14 ENCOUNTER — Encounter: Payer: Self-pay | Admitting: Family Medicine

## 2019-01-14 DIAGNOSIS — Z1212 Encounter for screening for malignant neoplasm of rectum: Secondary | ICD-10-CM | POA: Diagnosis not present

## 2019-01-14 DIAGNOSIS — Z1211 Encounter for screening for malignant neoplasm of colon: Secondary | ICD-10-CM | POA: Diagnosis not present

## 2019-01-14 DIAGNOSIS — G4733 Obstructive sleep apnea (adult) (pediatric): Secondary | ICD-10-CM | POA: Diagnosis not present

## 2019-01-14 DIAGNOSIS — I10 Essential (primary) hypertension: Secondary | ICD-10-CM | POA: Diagnosis not present

## 2019-01-14 DIAGNOSIS — G4710 Hypersomnia, unspecified: Secondary | ICD-10-CM | POA: Diagnosis not present

## 2019-01-16 LAB — COLOGUARD

## 2019-01-19 ENCOUNTER — Other Ambulatory Visit (HOSPITAL_BASED_OUTPATIENT_CLINIC_OR_DEPARTMENT_OTHER): Payer: Self-pay

## 2019-01-19 DIAGNOSIS — G4733 Obstructive sleep apnea (adult) (pediatric): Secondary | ICD-10-CM

## 2019-01-20 NOTE — Telephone Encounter (Signed)
Received the results of Cologuard screening.   Screening is negative.   A negative result indicates a low likelihood of colorectal cancer is present. Following a negative Cologuard result, the American Cancer Society recommends a Cologuard re-screening interval of 3 years.   

## 2019-01-28 ENCOUNTER — Ambulatory Visit: Payer: Commercial Managed Care - PPO | Attending: Neurology | Admitting: Neurology

## 2019-01-28 DIAGNOSIS — G4733 Obstructive sleep apnea (adult) (pediatric): Secondary | ICD-10-CM | POA: Diagnosis not present

## 2019-02-08 NOTE — Procedures (Signed)
Marion A. Merlene Laughter, MD     [email or web address]             NOCTURNAL POLYSOMNOGRAPHY   LOCATION: [facility]   Patient Name: James Murphy, James Murphy Date: 01/28/2019 Gender: Male D.O.B: [birth year] Age (years): 66 Referring Provider: Barton Fanny NP Height (inches): 69 Interpreting Physician: Phillips Odor MD, ABSM Weight (lbs): 291 RPSGT: Peak, Robert BMI: 43 MRN: [id number] Neck Size: 20.00 CLINICAL INFORMATION The patient is referred for a split night study with BPAP.  MEDICATIONS Medications self-administered by patient taken the night of the study : N/A  Current Outpatient Medications:  .  amLODipine (NORVASC) 10 MG tablet, TAKE 1 TABLET BY MOUTH DAILY., Disp: 90 tablet, Rfl: 1 .  aspirin 81 MG tablet, Take 81 mg by mouth daily., Disp: , Rfl:  .  benazepril (LOTENSIN) 40 MG tablet, TAKE 1 TABLET BY MOUTH DAILY., Disp: 90 tablet, Rfl: 1 .  Blood Glucose Monitoring Suppl (BLOOD GLUCOSE SYSTEM PAK) KIT, Please dispense based on patient and insurance preference. Use as directed to monitor FSBS 3x weekly. Dx: E11.9., Disp: 1 each, Rfl: 1 .  Glucose Blood (BLOOD GLUCOSE TEST STRIPS) STRP, Please dispense based on patient and insurance preference. Use as directed to monitor FSBS 3x weekly. Dx: E11.9., Disp: 50 each, Rfl: 11 .  Lancets MISC, Please dispense based on patient and insurance preference. Use as directed to monitor FSBS 3x weekly. Dx: E11.9., Disp: 50 each, Rfl: 11 .  metFORMIN (GLUCOPHAGE) 500 MG tablet, Take 1 tablet (500 mg total) by mouth daily with breakfast., Disp: 90 tablet, Rfl: 3 .  pravastatin (PRAVACHOL) 40 MG tablet, TAKE 1 TABLET BY MOUTH DAILY., Disp: 90 tablet, Rfl: 3   SLEEP STUDY TECHNIQUE As per the AASM Manual for the Scoring of Sleep and Associated Events v2.3 (April 2016) with a hypopnea requiring 4% desaturations.  The channels recorded and monitored were frontal, central and occipital EEG, electrooculogram (EOG),  submentalis EMG (chin), nasal and oral airflow, thoracic and abdominal wall motion, anterior tibialis EMG, snore microphone, electrocardiogram, and pulse oximetry. CPAP was attempted between pressures of 5-15 but there appears to be some difficulties tolerating this.  Bilevel pressures were therefore done between pressures of a 16/12 to 18/14. Bi-level positive airway pressure (BiPAP) was initiated when the patient met split night criteria and was titrated according to treat sleep-disordered breathing.  RESPIRATORY PARAMETERS Diagnostic  Total AHI (/hr): 93.2 RDI (/hr): 94.1 OA Index (/hr): 35 CA Index (/hr): 0.0 REM AHI (/hr): 64.0 NREM AHI (/hr): 95.4 Supine AHI (/hr): 81.5 Non-supine AHI (/hr): 95.58 Min O2 Sat (%): 70.0 Mean O2 (%): 88.9 Time below 88% (min): 55   Titration  Optimal IPAP Pressure (cm): 18 Optimal EPAP Pressure (cm): 14 AHI at Optimal Pressure (/hr): 1.9 Min O2 at Optimal Pressure (%): 89.0 Sleep % at Optimal (%): 96 Supine % at Optimal (%): 100     SLEEP ARCHITECTURE The study was initiated at 9:24:57 PM and terminated at 4:40:48 AM. The total recorded time was 435.9 minutes. EEG confirmed total sleep time was 377 minutes yielding a sleep efficiency of 86.5%%. Sleep onset after lights out was 12.4 minutes with a REM latency of 79.5 minutes. The patient spent 13.3%% of the night in stage N1 sleep, 60.7%% in stage N2 sleep, 0.0%% in stage N3 and 26% in REM. Wake after sleep onset (WASO) was 46.5 minutes. The Arousal Index was 25.3/hour.  LEG MOVEMENT DATA The total Periodic Limb Movements of Sleep (PLMS) were 0. The  PLMS index was 0.0 .  CARDIAC DATA The 2 lead EKG demonstrated sinus rhythm. The mean heart rate was 100.0 beats per minute. Other EKG findings include: None.   IMPRESSIONS Severe obstructive sleep apnea occurred during the diagnostic portion of the study (AHI = 93.2 /hour). The optimal BIPAP selected for this patient are ( 18 /14 cm of water)  Delano Metz,  MD Diplomate, American Board of Sleep Medicine.  ELECTRONICALLY SIGNED ON:  02/08/2019, 9:21 AM [place] PH: [phone]   FX: [phone] [place]

## 2019-02-25 DIAGNOSIS — G4733 Obstructive sleep apnea (adult) (pediatric): Secondary | ICD-10-CM | POA: Diagnosis not present

## 2019-02-25 DIAGNOSIS — I10 Essential (primary) hypertension: Secondary | ICD-10-CM | POA: Diagnosis not present

## 2019-02-25 DIAGNOSIS — G4710 Hypersomnia, unspecified: Secondary | ICD-10-CM | POA: Diagnosis not present

## 2019-03-17 ENCOUNTER — Other Ambulatory Visit: Payer: Self-pay | Admitting: Family Medicine

## 2019-03-17 DIAGNOSIS — G4733 Obstructive sleep apnea (adult) (pediatric): Secondary | ICD-10-CM | POA: Diagnosis not present

## 2019-03-17 MED FILL — PRAVASTATIN NA 40 MG TAB: 40 | 90 days supply | Qty: 90 | Fill #0

## 2019-03-17 MED FILL — BENAZEPRIL HCL 40 MG TABLET: 40 | 90 days supply | Qty: 90 | Fill #0

## 2019-03-17 MED FILL — AMLODIPINE BESYLATE 10 MG T: 10 | 90 days supply | Qty: 90 | Fill #0

## 2019-03-17 MED FILL — MONTELUKAST SOD 10 MG TAB: 10 | 30 days supply | Qty: 30 | Fill #0

## 2019-04-17 DIAGNOSIS — G4733 Obstructive sleep apnea (adult) (pediatric): Secondary | ICD-10-CM | POA: Diagnosis not present

## 2019-04-26 DIAGNOSIS — G4733 Obstructive sleep apnea (adult) (pediatric): Secondary | ICD-10-CM | POA: Diagnosis not present

## 2019-04-26 DIAGNOSIS — G4710 Hypersomnia, unspecified: Secondary | ICD-10-CM | POA: Diagnosis not present

## 2019-04-26 DIAGNOSIS — I10 Essential (primary) hypertension: Secondary | ICD-10-CM | POA: Diagnosis not present

## 2019-05-17 DIAGNOSIS — G4733 Obstructive sleep apnea (adult) (pediatric): Secondary | ICD-10-CM | POA: Diagnosis not present

## 2019-06-14 MED FILL — MONTELUKAST SOD 10 MG TAB: 10 | 30 days supply | Qty: 30 | Fill #0

## 2019-06-14 MED FILL — PRAVASTATIN NA 40 MG TAB: 40 | 90 days supply | Qty: 90 | Fill #0

## 2019-06-14 MED FILL — BENAZEPRIL HCL 40 MG TABLET: 40 | 90 days supply | Qty: 90 | Fill #0

## 2019-06-14 MED FILL — metFORMIN HCL 500 MG TABS: 500 | 90 days supply | Qty: 90 | Fill #1

## 2019-06-14 MED FILL — AMLODIPINE BESYLATE 10 MG T: 10 | 90 days supply | Qty: 90 | Fill #0

## 2019-06-17 DIAGNOSIS — G4733 Obstructive sleep apnea (adult) (pediatric): Secondary | ICD-10-CM | POA: Diagnosis not present

## 2019-06-22 ENCOUNTER — Ambulatory Visit: Payer: 59 | Admitting: Family Medicine

## 2019-06-23 DIAGNOSIS — G4733 Obstructive sleep apnea (adult) (pediatric): Secondary | ICD-10-CM | POA: Diagnosis not present

## 2019-07-17 DIAGNOSIS — G4733 Obstructive sleep apnea (adult) (pediatric): Secondary | ICD-10-CM | POA: Diagnosis not present

## 2019-09-09 ENCOUNTER — Other Ambulatory Visit: Payer: Self-pay

## 2019-09-09 ENCOUNTER — Ambulatory Visit (INDEPENDENT_AMBULATORY_CARE_PROVIDER_SITE_OTHER): Payer: 59 | Admitting: *Deleted

## 2019-09-09 DIAGNOSIS — Z23 Encounter for immunization: Secondary | ICD-10-CM

## 2019-09-09 NOTE — Progress Notes (Signed)
Patient seen in office for Influenza Vaccination.   Tolerated IM administration well.   Immunization history updated.  

## 2019-09-14 ENCOUNTER — Other Ambulatory Visit: Payer: Self-pay

## 2019-09-15 ENCOUNTER — Encounter: Payer: Self-pay | Admitting: Family Medicine

## 2019-09-15 ENCOUNTER — Ambulatory Visit: Payer: 59 | Admitting: Family Medicine

## 2019-09-15 VITALS — BP 142/88 | HR 96 | Temp 98.7°F | Resp 18 | Ht 69.0 in | Wt 292.0 lb

## 2019-09-15 DIAGNOSIS — Z6841 Body Mass Index (BMI) 40.0 and over, adult: Secondary | ICD-10-CM | POA: Diagnosis not present

## 2019-09-15 DIAGNOSIS — I10 Essential (primary) hypertension: Secondary | ICD-10-CM | POA: Diagnosis not present

## 2019-09-15 DIAGNOSIS — M79642 Pain in left hand: Secondary | ICD-10-CM | POA: Diagnosis not present

## 2019-09-15 DIAGNOSIS — E7800 Pure hypercholesterolemia, unspecified: Secondary | ICD-10-CM | POA: Diagnosis not present

## 2019-09-15 DIAGNOSIS — L02416 Cutaneous abscess of left lower limb: Secondary | ICD-10-CM

## 2019-09-15 DIAGNOSIS — R252 Cramp and spasm: Secondary | ICD-10-CM | POA: Diagnosis not present

## 2019-09-15 DIAGNOSIS — E119 Type 2 diabetes mellitus without complications: Secondary | ICD-10-CM

## 2019-09-15 DIAGNOSIS — J302 Other seasonal allergic rhinitis: Secondary | ICD-10-CM | POA: Diagnosis not present

## 2019-09-15 MED ORDER — SULFAMETHOXAZOLE-TRIMETHOPRIM 800-160 MG PO TABS: 1.0000 | ORAL_TABLET | Freq: Two times a day (BID) | ORAL | 0 refills | Status: DC

## 2019-09-15 MED FILL — SULFAMETHOXAZOLE-TMP DS TAB: 800-160 | 10 days supply | Qty: 20 | Fill #0

## 2019-09-15 NOTE — Progress Notes (Signed)
Subjective:    Patient ID: James Murphy, male    DOB: 04/29/1966, 53 y.o.   MRN: [id number]  Patient presents for Abscess (x2 weeks- inner thigh on L leg- no drainage)  Pt here with boil on inner left leg for past month, getting worse in the past 2 weeks, fever or chills, no drainage from the leg    Left ear discomfort with pain shooting into ear for past 5 days, no drainage from ear, has sinus pressure and drainage, taking allergy med zyrtec doesn't always help   HTN- taking bP meds , takes at bedtime  DM- takes metformin at bedtime, last A1C 6.7%   Left hand pain, gets drawing in hand, pain mostly at ase of the thumb, and the pain goes into the wrist arm, when he gets the pain and hand draws up , he cant hold anyting , he does feel little nodules    No tingling numbness in fingertips   Review Of Systems:  GEN- denies fatigue, fever, weight loss,weakness, recent illness HEENT- denies eye drainage, change in vision, +nasal discharge, CVS- denies chest pain, palpitations RESP- denies SOB, cough, wheeze ABD- denies N/V, change in stools, abd pain GU- denies dysuria, hematuria, dribbling, incontinence MSK- denies joint pain, muscle aches, injury Neuro- denies headache, dizziness, syncope, seizure activity       Objective:    BP (!) 142/88   Pulse 96   Temp 98.7 F (37.1 C) (Oral)   Resp 18   Ht 5\' 9"  (1.753 m)   Wt 292 lb (132.5 kg)   SpO2 100%   BMI 43.12 kg/m  GEN- NAD, alert and oriented x3   Repeat BP  142/90 HEENT- PERRL, EOMI, non injected sclera, pink conjunctiva, MMM, oropharynx clear, nares enlarged turbinates clear rhinorrhea no maxillary tenderness, TM clear bilaterally no effusion. Neck- Supple, no thyromegaly, no lymphadenopathy CVS- RRR, no murmur RESP-CTAB ABD-NABS,soft,NT,ND Skin- inner right thigh, grape size indurated lesion, with small indurated spot dime size associated , TTP, hyperpigmentation of skin overlying  Neuro- CNII-XII in tact, neg  tinels/phalens bilat hands, normal grasp, strength equal bilat MSK- Mild TTP base of thuimb, ?small nodule palpated in palm beneath thumb EXT- No edema Pulses- Radial, DP- 2+   Procedure- Incision and Drainage Procedure explained to patient questions answered benefits and risks discussed vernal  consent obtained. Antiseptic-Betadine Anesthesia-lidocaine 1% with epi Incision performedmoderate amount of pus expressed Culture taken Minimal blood loss Patient tolerated procedure well Bandage applied      Assessment & Plan:      Problem List Items Addressed This Visit      Unprioritized   Diabetes (HCC)    Goal is A1c less than 7% continue metformin      Relevant Orders   Hemoglobin A1c   Lipid panel   Hyperlipidemia   Relevant Orders   Lipid panel   Hypertension - Primary    Blood pressure is elevated.  He is currently on 2 medications.  He has not been following proper diet has gained some weight We discussed dietary changes he wants to work on his diet before going on another medication.  We will recheck him in 3 months      Relevant Orders   CBC with Differential/Platelet   Comprehensive metabolic panel   Obesity   Seasonal allergies    No sign of acute sinusitis however he does have some ear pressure and some clear drainage will have him use Nasacort which I gave him a sample of  from the office.  He already has antibiotics for the abscess so if he is developing sinusitis this should cover it       Other Visit Diagnoses    Abscess of left leg       s/p I and D, start bactrim, culture sent    Relevant Orders   WOUND CULTURE   Hand cramp       check electrolytes, has small nodule could be a cyst on tendon, other possibility carpal tunnel, he wants to hold on hand surgery referral   Relevant Orders   Magnesium   Left hand pain          Note: This dictation was prepared with Dragon dictation along with smaller phrase technology. Any transcriptional errors that  result from this process are unintentional.

## 2019-09-15 NOTE — Patient Instructions (Addendum)
Take antibiotics as prescribed Use nasal spray Continue zyrtec Call if you want referral to hand doctor Take metformin with breakfast or dinner  Check blood pressure at home  F/U 4 months for Physical

## 2019-09-15 NOTE — Assessment & Plan Note (Signed)
No sign of acute sinusitis however he does have some ear pressure and some clear drainage will have him use Nasacort which I gave him a sample of from the office.  He already has antibiotics for the abscess so if he is developing sinusitis this should cover it

## 2019-09-15 NOTE — Assessment & Plan Note (Signed)
Blood pressure is elevated.  He is currently on 2 medications.  He has not been following proper diet has gained some weight We discussed dietary changes he wants to work on his diet before going on another medication.  We will recheck him in 3 months

## 2019-09-15 NOTE — Assessment & Plan Note (Signed)
Goal is A1c less than 7% continue metformin

## 2019-09-16 LAB — CBC WITH DIFFERENTIAL/PLATELET
Absolute Monocytes: 836 cells/uL (ref 200–950)
Basophils Absolute: 86 cells/uL (ref 0–200)
Basophils Relative: 0.9 %
Eosinophils Absolute: 456 cells/uL (ref 15–500)
Eosinophils Relative: 4.8 %
HCT: 42.8 % (ref 38.5–50.0)
Hemoglobin: 13.8 g/dL (ref 13.2–17.1)
Lymphs Abs: 5719 cells/uL — ABNORMAL HIGH (ref 850–3900)
MCH: 28.6 pg (ref 27.0–33.0)
MCHC: 32.2 g/dL (ref 32.0–36.0)
MCV: 88.8 fL (ref 80.0–100.0)
MPV: 10.1 fL (ref 7.5–12.5)
Monocytes Relative: 8.8 %
Neutro Abs: 2404 cells/uL (ref 1500–7800)
Neutrophils Relative %: 25.3 %
Platelets: 356 10*3/uL (ref 140–400)
RBC: 4.82 10*6/uL (ref 4.20–5.80)
RDW: 13.9 % (ref 11.0–15.0)
Total Lymphocyte: 60.2 %
WBC: 9.5 10*3/uL (ref 3.8–10.8)

## 2019-09-16 LAB — LIPID PANEL
Cholesterol: 146 mg/dL (ref <200)
HDL: 38 mg/dL — ABNORMAL LOW (ref >=40)
LDL Cholesterol (Calc): 91 mg/dL (calc)
Non-HDL Cholesterol (Calc): 108 mg/dL (calc) (ref <130)
Total CHOL/HDL Ratio: 3.8 (calc) (ref <5.0)
Triglycerides: 78 mg/dL (ref <150)

## 2019-09-16 LAB — COMPREHENSIVE METABOLIC PANEL
AG Ratio: 1.5 (calc) (ref 1.0–2.5)
ALT: 25 U/L (ref 9–46)
AST: 17 U/L (ref 10–35)
Albumin: 4.5 g/dL (ref 3.6–5.1)
Alkaline phosphatase (APISO): 50 U/L (ref 35–144)
BUN: 20 mg/dL (ref 7–25)
CO2: 25 mmol/L (ref 20–32)
Calcium: 9.6 mg/dL (ref 8.6–10.3)
Chloride: 104 mmol/L (ref 98–110)
Creat: 1.23 mg/dL (ref 0.70–1.33)
Globulin: 3.1 g/dL (calc) (ref 1.9–3.7)
Glucose, Bld: 121 mg/dL — ABNORMAL HIGH (ref 65–99)
Potassium: 4.2 mmol/L (ref 3.5–5.3)
Sodium: 141 mmol/L (ref 135–146)
Total Bilirubin: 0.4 mg/dL (ref 0.2–1.2)
Total Protein: 7.6 g/dL (ref 6.1–8.1)

## 2019-09-16 LAB — HEMOGLOBIN A1C
Hgb A1c MFr Bld: 6.7 % of total Hgb — ABNORMAL HIGH (ref <5.7)
Mean Plasma Glucose: 146 (calc)
eAG (mmol/L): 8.1 (calc)

## 2019-09-16 LAB — MAGNESIUM: Magnesium: 1.9 mg/dL (ref 1.5–2.5)

## 2019-09-19 LAB — WOUND CULTURE
MICRO NUMBER:: 913993
SPECIMEN QUALITY:: ADEQUATE

## 2019-11-04 ENCOUNTER — Other Ambulatory Visit: Payer: Self-pay | Admitting: Family Medicine

## 2019-11-04 MED FILL — PRAVASTATIN NA 40 MG TAB: 40 | 90 days supply | Qty: 90 | Fill #1

## 2019-11-04 MED FILL — metFORMIN HCL 500 MG TABS: 500 | 90 days supply | Qty: 90 | Fill #2

## 2019-11-04 MED FILL — AMLODIPINE BESYLATE 10 MG T: 10 | 90 days supply | Qty: 90 | Fill #0

## 2019-11-04 MED FILL — BENAZEPRIL HCL 40 MG TABLET: 40 | 90 days supply | Qty: 90 | Fill #0

## 2019-12-10 ENCOUNTER — Other Ambulatory Visit: Payer: Self-pay

## 2019-12-10 ENCOUNTER — Encounter: Payer: Self-pay | Admitting: Family Medicine

## 2019-12-10 ENCOUNTER — Ambulatory Visit (INDEPENDENT_AMBULATORY_CARE_PROVIDER_SITE_OTHER): Payer: 59 | Admitting: Family Medicine

## 2019-12-10 DIAGNOSIS — Z20828 Contact with and (suspected) exposure to other viral communicable diseases: Secondary | ICD-10-CM | POA: Diagnosis not present

## 2019-12-10 DIAGNOSIS — J069 Acute upper respiratory infection, unspecified: Secondary | ICD-10-CM

## 2019-12-10 DIAGNOSIS — J019 Acute sinusitis, unspecified: Secondary | ICD-10-CM | POA: Diagnosis not present

## 2019-12-10 DIAGNOSIS — Z20822 Contact with and (suspected) exposure to covid-19: Secondary | ICD-10-CM

## 2019-12-10 MED ORDER — GUAIFENESIN-CODEINE 100-10 MG/5ML PO SOLN: 10.0000 mL | Freq: Four times a day (QID) | ORAL | 0 refills | Status: DC | PRN

## 2019-12-10 MED ORDER — AMOXICILLIN-POT CLAVULANATE 875-125 MG PO TABS: 1.0000 | ORAL_TABLET | Freq: Two times a day (BID) | ORAL | 0 refills | Status: DC

## 2019-12-10 MED FILL — GUAIATUSSIN AC LIQUID: 100-10 | 5 days supply | Qty: 180 | Fill #0

## 2019-12-10 MED FILL — AMOX-CLAV 875-125 MG TABLET: 875-125 | 10 days supply | Qty: 20 | Fill #0

## 2019-12-10 NOTE — Progress Notes (Signed)
Virtual Visit via Telephone Note  I connected with James Murphy on 12/10/19 at 10:09am by telephone and verified that I am speaking with the correct person using two identifiers.      Pt location: at home   Physician location:  In office, [facility], Vic Blackbird MD     On call: patient and physician   I discussed the limitations, risks, security and privacy concerns of performing an evaluation and management service by telephone and the availability of in person appointments. I also discussed with the patient that there may be a patient responsible charge related to this service. The patient expressed understanding and agreed to proceed.   History of Present Illness: Pt called in with sore throat, sinus drainage since Sunday. He had low grade. He has been taking zyrtec  Has bad bad cough as well, sinus pressure, hoarse voice. OTC cough medicine No body aches or headache, no vomiting, Wife had COVID 19 a week ago and received monoclonal antibody    Observations/Objective: Hoarse voice noted on the phone congested sounding.  No acute respiratory distress noted over the phone.  Able to speak in full sentences  Assessment and Plan: Viral upper respiratory infection along with some sinusitis.  He does have underlying diabetes mellitus hypertension hyperlipidemia so is high risk for complications from [id number].  Recommend he go get Covid testing since his wife was actually positive.  Also recommend he quarantine.  We will go ahead and add on Augmentin to cover for sinusitis and he has been prescribed Robitussin with codeine for cough and congestion.  Follow Up Instructions:    I discussed the assessment and treatment plan with the patient. The patient was provided an opportunity to ask questions and all were answered. The patient agreed with the plan and demonstrated an understanding of the instructions.   The patient was advised to call back or seek an in-person evaluation  if the symptoms worsen or if the condition fails to improve as anticipated.  I provided 9  minutes of non-face-to-face time during this encounter. End Time 10:16am   Vic Blackbird, MD

## 2020-01-17 ENCOUNTER — Encounter: Payer: 59 | Admitting: Family Medicine

## 2020-01-20 DIAGNOSIS — M25561 Pain in right knee: Secondary | ICD-10-CM | POA: Diagnosis not present

## 2020-01-20 DIAGNOSIS — M79671 Pain in right foot: Secondary | ICD-10-CM | POA: Diagnosis not present

## 2020-01-20 MED FILL — predniSONE 10 MG TABS: 10 | 6 days supply | Qty: 21 | Fill #0

## 2020-01-20 MED FILL — MELOXICAM 15 MG TABLET: 15 | 30 days supply | Qty: 30 | Fill #0

## 2020-04-07 ENCOUNTER — Other Ambulatory Visit: Payer: Self-pay | Admitting: Family Medicine

## 2020-04-07 MED FILL — MELOXICAM 15 MG TABLET: 15 | 30 days supply | Qty: 30 | Fill #1

## 2020-04-07 MED FILL — BENAZEPRIL HCL 40 MG TABLET: 40 | 90 days supply | Qty: 90 | Fill #0

## 2020-04-07 MED FILL — metFORMIN HCL 500 MG TABS: 500 | 90 days supply | Qty: 90 | Fill #0

## 2020-04-07 MED FILL — PRAVASTATIN NA 40 MG TAB: 40 | 90 days supply | Qty: 90 | Fill #0

## 2020-04-07 MED FILL — AMLODIPINE BESYLATE 10 MG T: 10 | 90 days supply | Qty: 90 | Fill #0

## 2020-07-03 ENCOUNTER — Other Ambulatory Visit: Payer: Self-pay

## 2020-07-03 ENCOUNTER — Other Ambulatory Visit: Payer: Self-pay | Admitting: Family Medicine

## 2020-07-03 ENCOUNTER — Encounter: Payer: Self-pay | Admitting: Family Medicine

## 2020-07-03 ENCOUNTER — Ambulatory Visit: Payer: 59 | Admitting: Family Medicine

## 2020-07-03 VITALS — BP 140/88 | HR 90 | Temp 98.3°F | Resp 16 | Ht 69.0 in | Wt 298.0 lb

## 2020-07-03 DIAGNOSIS — Z125 Encounter for screening for malignant neoplasm of prostate: Secondary | ICD-10-CM | POA: Diagnosis not present

## 2020-07-03 DIAGNOSIS — Z1159 Encounter for screening for other viral diseases: Secondary | ICD-10-CM | POA: Diagnosis not present

## 2020-07-03 DIAGNOSIS — E559 Vitamin D deficiency, unspecified: Secondary | ICD-10-CM

## 2020-07-03 DIAGNOSIS — E119 Type 2 diabetes mellitus without complications: Secondary | ICD-10-CM | POA: Diagnosis not present

## 2020-07-03 DIAGNOSIS — E7800 Pure hypercholesterolemia, unspecified: Secondary | ICD-10-CM

## 2020-07-03 DIAGNOSIS — Z6841 Body Mass Index (BMI) 40.0 and over, adult: Secondary | ICD-10-CM

## 2020-07-03 DIAGNOSIS — I10 Essential (primary) hypertension: Secondary | ICD-10-CM | POA: Diagnosis not present

## 2020-07-03 DIAGNOSIS — L918 Other hypertrophic disorders of the skin: Secondary | ICD-10-CM

## 2020-07-03 DIAGNOSIS — G4733 Obstructive sleep apnea (adult) (pediatric): Secondary | ICD-10-CM | POA: Diagnosis not present

## 2020-07-03 DIAGNOSIS — M7989 Other specified soft tissue disorders: Secondary | ICD-10-CM

## 2020-07-03 MED ORDER — SHINGRIX 50 MCG/0.5ML IM SUSR: 0.5000 mL | Freq: Once | INTRAMUSCULAR | 1 refills | Status: AC

## 2020-07-03 MED ORDER — HYDROCHLOROTHIAZIDE 12.5 MG PO CAPS: 12.5000 mg | ORAL_CAPSULE | Freq: Every day | ORAL | 1 refills | Status: DC | PRN

## 2020-07-03 MED FILL — METFORMIN HCL 500 MG TABS: 500 | 90 days supply | Qty: 90 | Fill #1

## 2020-07-03 MED FILL — AMLODIPINE BESYLATE 10 MG T: 10 | 90 days supply | Qty: 90 | Fill #0

## 2020-07-03 MED FILL — BENAZEPRIL HCL 40 MG TABLET: 40 | 90 days supply | Qty: 90 | Fill #0

## 2020-07-03 MED FILL — PRAVASTATIN NA 40 MG TAB: 40 | 90 days supply | Qty: 90 | Fill #1

## 2020-07-03 NOTE — Patient Instructions (Addendum)
F/u 4 MONTHS FOR Physical  Goal for blood pressure less than 130/80 We discussed OZEMPIC once a week for weight and blood sugar I will call with lab results  Try to use one of the CPAP Mask Shingles vaccine sent to pharmacy  Use HCTZ once a day as needed for fluid

## 2020-07-03 NOTE — Assessment & Plan Note (Signed)
Uncontrolled Refilled meds, add hctz 12.5MG  PRN leg swelling May need to take daily but will have him monitor at home

## 2020-07-03 NOTE — Assessment & Plan Note (Signed)
He has not been using his CPAP Advised to use best mask he has , OSA affects many organs

## 2020-07-03 NOTE — Progress Notes (Signed)
Subjective:    Patient ID: James Murphy, male    DOB: 19-Apr-1966, 54 y.o.   MRN: [id number]  Patient presents for Follow-up (is fasting)   Pt here to f/u chronic medical problems    HTN- he is prescribed  norvasc/ lotensin , recently took medication this morning but states he only had 1 bp pill, out of the other, but he cant tell me which one it is   Hyperlipidemia- taking statin in AM so he doesn't forget  MTF- taking metformon 500mg  one a day , last A1C 6.7%   OSA- he has tried multiple mask, he has not been using CPAP   Multiple skin tags around neck with a darkening around his neck and states he has then on abdomen, and on buttocks  He has had some intermittant swelling in his feet as well  Seen by Dr.Kendall, told he had neuropaty in his right heel    COVID Vaccine- [facility] [place]   Review Of Systems:  GEN- denies fatigue, fever, weight loss,weakness, recent illness HEENT- denies eye drainage, change in vision, nasal discharge, CVS- denies chest pain, palpitations RESP- denies SOB, cough, wheeze ABD- denies N/V, change in stools, abd pain GU- denies dysuria, hematuria, dribbling, incontinence MSK-+ joint pain, muscle aches, injury Neuro- denies headache, dizziness, syncope, seizure activity       Objective:    BP 140/88   Pulse 90   Temp 98.3 F (36.8 C) (Temporal)   Resp 16   Ht 5\' 9"  (1.753 m)   Wt 298 lb (135.2 kg)   SpO2 97%   BMI 44.01 kg/m  GEN- NAD, alert and oriented x3 HEENT- PERRL, EOMI, non injected sclera, pink conjunctiva, MMM, oropharynx clear Neck- Supple, no thyromegaly CVS- RRR, no murmur RESP-CTAB Skin- mild acanthosis around neck with skin tags scattered around neck, few lesions beneath pannus  ABD-NABS,soft,NT,ND EXT- No edema Pulses- Radial, DP- 2+        Assessment & Plan:      Problem List Items Addressed This Visit      Unprioritized   Diabetes (HCC)    Recheck A1C with weight gain concern above goal of  7% He  is willing to try GLP-1 therapy, which would help with DM and his weight      Relevant Orders   Hemoglobin A1c   Lipid panel   HM DIABETES FOOT EXAM (Completed)   Microalbumin / creatinine urine ratio   Hyperlipidemia   Relevant Medications   hydrochlorothiazide (MICROZIDE) 12.5 MG capsule   Other Relevant Orders   Lipid panel   Hypertension - Primary    Uncontrolled Refilled meds, add hctz 12.5MG  PRN leg swelling May need to take daily but will have him monitor at home       Relevant Medications   hydrochlorothiazide (MICROZIDE) 12.5 MG capsule   Other Relevant Orders   CBC with Differential/Platelet   Comprehensive metabolic panel   TSH   Obesity   OSA (obstructive sleep apnea)    He has not been using his CPAP Advised to use best mask he has , OSA affects many organs       Vitamin D deficiency   Relevant Orders   Vitamin D, 25-hydroxy    Other Visit Diagnoses    Prostate cancer screening       Relevant Orders   PSA   Need for hepatitis C screening test       Relevant Orders   Hepatitis C antibody   Skin tags, multiple  acquired       discussed removal he will let us know when he wants dermatology referral   Leg swelling       not noted today, prn hctz, WORK on reducing salt intake, increase physical activity sits most of day       Note: This dictation was prepared with Dragon dictation along with smaller phrase technology. Any transcriptional errors that result from this process are unintentional.

## 2020-07-03 NOTE — Assessment & Plan Note (Signed)
Recheck A1C with weight gain concern above goal of  7% He is willing to try GLP-1 therapy, which would help with DM and his weight

## 2020-07-04 LAB — CBC WITH DIFFERENTIAL/PLATELET
Absolute Monocytes: 777 cells/uL (ref 200–950)
Basophils Absolute: 49 cells/uL (ref 0–200)
Basophils Relative: 0.7 %
Eosinophils Absolute: 217 cells/uL (ref 15–500)
Eosinophils Relative: 3.1 %
HCT: 42.9 % (ref 38.5–50.0)
Hemoglobin: 14 g/dL (ref 13.2–17.1)
Lymphs Abs: 4368 cells/uL — ABNORMAL HIGH (ref 850–3900)
MCH: 29.4 pg (ref 27.0–33.0)
MCHC: 32.6 g/dL (ref 32.0–36.0)
MCV: 89.9 fL (ref 80.0–100.0)
MPV: 9.8 fL (ref 7.5–12.5)
Monocytes Relative: 11.1 %
Neutro Abs: 1589 cells/uL (ref 1500–7800)
Neutrophils Relative %: 22.7 %
Platelets: 346 10*3/uL (ref 140–400)
RBC: 4.77 10*6/uL (ref 4.20–5.80)
RDW: 13.5 % (ref 11.0–15.0)
Total Lymphocyte: 62.4 %
WBC: 7 10*3/uL (ref 3.8–10.8)

## 2020-07-04 LAB — HEMOGLOBIN A1C
Hgb A1c MFr Bld: 6.7 % of total Hgb — ABNORMAL HIGH (ref <5.7)
Mean Plasma Glucose: 146 (calc)
eAG (mmol/L): 8.1 (calc)

## 2020-07-04 LAB — LIPID PANEL
Cholesterol: 135 mg/dL (ref <200)
HDL: 35 mg/dL — ABNORMAL LOW (ref >=40)
LDL Cholesterol (Calc): 84 mg/dL (calc)
Non-HDL Cholesterol (Calc): 100 mg/dL (calc) (ref <130)
Total CHOL/HDL Ratio: 3.9 (calc) (ref <5.0)
Triglycerides: 74 mg/dL (ref <150)

## 2020-07-04 LAB — MICROALBUMIN / CREATININE URINE RATIO
Creatinine, Urine: 217 mg/dL (ref 20–320)
Microalb Creat Ratio: 3 mcg/mg creat (ref <30)
Microalb, Ur: 0.7 mg/dL

## 2020-07-04 LAB — COMPREHENSIVE METABOLIC PANEL
AG Ratio: 1.4 (calc) (ref 1.0–2.5)
ALT: 22 U/L (ref 9–46)
AST: 17 U/L (ref 10–35)
Albumin: 4.2 g/dL (ref 3.6–5.1)
Alkaline phosphatase (APISO): 49 U/L (ref 35–144)
BUN: 17 mg/dL (ref 7–25)
CO2: 25 mmol/L (ref 20–32)
Calcium: 9.5 mg/dL (ref 8.6–10.3)
Chloride: 104 mmol/L (ref 98–110)
Creat: 1.28 mg/dL (ref 0.70–1.33)
Globulin: 3.1 g/dL (calc) (ref 1.9–3.7)
Glucose, Bld: 101 mg/dL — ABNORMAL HIGH (ref 65–99)
Potassium: 4.4 mmol/L (ref 3.5–5.3)
Sodium: 139 mmol/L (ref 135–146)
Total Bilirubin: 0.3 mg/dL (ref 0.2–1.2)
Total Protein: 7.3 g/dL (ref 6.1–8.1)

## 2020-07-04 LAB — PSA: PSA: 1.8 ng/mL (ref <=4.0)

## 2020-07-04 LAB — VITAMIN D 25 HYDROXY (VIT D DEFICIENCY, FRACTURES): Vit D, 25-Hydroxy: 22 ng/mL — ABNORMAL LOW (ref 30–100)

## 2020-07-04 LAB — HEPATITIS C ANTIBODY
Hepatitis C Ab: NONREACTIVE
SIGNAL TO CUT-OFF: 0.01 (ref <1.00)

## 2020-07-04 LAB — TSH: TSH: 1.6 mIU/L (ref 0.40–4.50)

## 2020-07-10 ENCOUNTER — Other Ambulatory Visit: Payer: Self-pay | Admitting: *Deleted

## 2020-07-10 MED ORDER — OZEMPIC (0.25 OR 0.5 MG/DOSE) 2 MG/1.5ML ~~LOC~~ SOPN: 0.2500 mg | PEN_INJECTOR | SUBCUTANEOUS | 3 refills | Status: DC

## 2020-07-10 MED FILL — OZEMPIC 0.25 OR 0.5 MG/DOSE: 2 | 56 days supply | Qty: 2 | Fill #0

## 2020-08-22 ENCOUNTER — Ambulatory Visit: Payer: Self-pay | Admitting: Family Medicine

## 2020-08-22 NOTE — Progress Notes (Deleted)
° °  Subjective:    Patient ID: James Murphy, male    DOB: [birth year], 54 y.o.   MRN: [id number]  Patient presents for No chief complaint on file.  Patient here for interim follow-up on his hypertension as well as his weight.  Pretension the last visit blood pressure was elevated I added HCTZ 12.5 mg In addition to his amlodipine 10 mg.  Also on benazepril 40 mg once a day.  Blood pressure at home has ranged.  Obesity in the setting of diabetes mellitus type 2 his A1c returned at 6.7% in July. Already on Metformin.  Recommend he start Ozempic 0.25 mg weekly to help with glucose control and weight.  His weight at the last visit was 298 pounds     Review Of Systems:  GEN- denies fatigue, fever, weight loss,weakness, recent illness HEENT- denies eye drainage, change in vision, nasal discharge, CVS- denies chest pain, palpitations RESP- denies SOB, cough, wheeze ABD- denies N/V, change in stools, abd pain GU- denies dysuria, hematuria, dribbling, incontinence MSK- denies joint pain, muscle aches, injury Neuro- denies headache, dizziness, syncope, seizure activity       Objective:    There were no vitals taken for this visit. GEN- NAD, alert and oriented x3 HEENT- PERRL, EOMI, non injected sclera, pink conjunctiva, MMM, oropharynx clear Neck- Supple, no thyromegaly CVS- RRR, no murmur RESP-CTAB ABD-NABS,soft,NT,ND EXT- No edema Pulses- Radial, DP- 2+        Assessment & Plan:      Problem List Items Addressed This Visit      Unprioritized   Diabetes (HCC)   Hypertension - Primary   Obesity      Note: This dictation was prepared with Dragon dictation along with smaller phrase technology. Any transcriptional errors that result from this process are unintentional.

## 2020-08-25 MED FILL — OZEMPIC 0.25 OR 0.5 MG/DOSE: 2 | 56 days supply | Qty: 2 | Fill #0

## 2020-09-12 ENCOUNTER — Encounter: Payer: Self-pay | Admitting: Family Medicine

## 2020-09-12 ENCOUNTER — Other Ambulatory Visit: Payer: Self-pay

## 2020-09-12 ENCOUNTER — Ambulatory Visit (INDEPENDENT_AMBULATORY_CARE_PROVIDER_SITE_OTHER): Payer: 59 | Admitting: Family Medicine

## 2020-09-12 VITALS — BP 138/90 | HR 62 | Temp 98.2°F | Resp 14 | Ht 69.0 in | Wt 302.0 lb

## 2020-09-12 DIAGNOSIS — I10 Essential (primary) hypertension: Secondary | ICD-10-CM | POA: Diagnosis not present

## 2020-09-12 DIAGNOSIS — Z23 Encounter for immunization: Secondary | ICD-10-CM

## 2020-09-12 DIAGNOSIS — E1169 Type 2 diabetes mellitus with other specified complication: Secondary | ICD-10-CM

## 2020-09-12 MED ORDER — HYDROCHLOROTHIAZIDE 12.5 MG PO CAPS: 12.5000 mg | ORAL_CAPSULE | Freq: Every day | ORAL | 1 refills | Status: DC

## 2020-09-12 MED FILL — HYDROCHLOROTHIAZIDE 12.5 MG: 12.5 | 90 days supply | Qty: 90 | Fill #0

## 2020-09-12 NOTE — Assessment & Plan Note (Signed)
Discussed dietary changes Increasing protein , eating small meal mid day Change to 1 diet drink a day, he needs caffiene Continue ozempic and metformin Next visit repeat labs

## 2020-09-12 NOTE — Patient Instructions (Addendum)
Pen needles sent to pharmacy  Cut out soda, diet pepsi once a day  Eat more protein and fruit mid day  Start new blood pressure medicine HCTZ once a day In the morning Check your blood pressure < 130/80 F/U 3 months

## 2020-09-12 NOTE — Progress Notes (Signed)
   Subjective:    Patient ID: James Murphy, male    DOB: 1966-10-01, 54 y.o.   MRN: [id number]  Patient presents for Follow-up (is fasting)  Patient here to follow-up medications.  He was started on Ozempic 0.25mg  about 6 weeks ago.  His weight is actually up a few pounds but he is wearing his steel toed boots.  He states that he has noticed significant decrease in his appetite.  He is not craving sweets as much.  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  States he drinks Pepsi or Coca-Cola at least 2 or 3 a day.  He often does not eat lunch because he is not hungry.  He is typically stopping at fast food.  He was also supposed to start HCTZ to help with blood pressure and swelling but he did not realize the medication went to the pharmacy.  He does not have any new concerns today.  He would like a flu shot.    Review Of Systems:  GEN- denies fatigue, fever, weight loss,weakness, recent illness HEENT- denies eye drainage, change in vision, nasal discharge, CVS- denies chest pain, palpitations RESP- denies SOB, cough, wheeze ABD- denies N/V, change in stools, abd pain GU- denies dysuria, hematuria, dribbling, incontinence MSK- denies joint pain, muscle aches, injury Neuro- denies headache, dizziness, syncope, seizure activity       Objective:    BP 138/90 (BP Location: Right Arm, Cuff Size: Large)   Pulse 62   Temp 98.2 F (36.8 C) (Temporal)   Resp 14   Ht 5\' 9"  (1.753 m)   Wt (!) 302 lb (137 kg)   SpO2 98%   BMI 44.60 kg/m  GEN- NAD, alert and oriented x3 HEENT- PERRL, EOMI, non injected sclera, pink conjunctiva, Neck- Supple, no thyromegaly CVS- RRR, no murmur RESP-CTAB EXT- No edema Pulses- Radial, DP- 2+        Assessment & Plan:      Problem List Items Addressed This Visit      Unprioritized   Diabetes (HCC)    Discussed dietary changes Increasing protein , eating small meal mid day Change to 1 diet drink a day, he needs caffiene Continue  ozempic and metformin Next visit repeat labs       Hypertension - Primary    Uncontrolled, add HCTZ 12.5mg  Continue norvasc and lotensin Check bp at home goal < 130/80      Relevant Medications   hydrochlorothiazide (MICROZIDE) 12.5 MG capsule    Other Visit Diagnoses    Need for immunization against influenza       Relevant Orders   Flu Vaccine QUAD 36+ mos IM (Completed)      Note: This dictation was prepared with Dragon dictation along with smaller phrase technology. Any transcriptional errors that result from this process are unintentional.

## 2020-09-12 NOTE — Assessment & Plan Note (Signed)
Uncontrolled, add HCTZ 12.5mg  Continue norvasc and lotensin Check bp at home goal < 130/80

## 2020-10-03 MED FILL — BENAZEPRIL HCL 40 MG TABLET: 40 | 90 days supply | Qty: 90 | Fill #1

## 2020-10-03 MED FILL — METFORMIN HCL 500 MG TABS: 500 | 90 days supply | Qty: 90 | Fill #2

## 2020-10-03 MED FILL — PRAVASTATIN NA 40 MG TAB: 40 | 90 days supply | Qty: 90 | Fill #2

## 2020-10-03 MED FILL — AMLODIPINE BESYLATE 10 MG T: 10 | 90 days supply | Qty: 90 | Fill #1

## 2020-10-12 DIAGNOSIS — Z20822 Contact with and (suspected) exposure to covid-19: Secondary | ICD-10-CM | POA: Diagnosis not present

## 2020-10-16 MED FILL — OZEMPIC 0.25 OR 0.5 MG/DOSE: 2 | 56 days supply | Qty: 2 | Fill #1

## 2020-11-08 ENCOUNTER — Encounter: Payer: Self-pay | Admitting: Family Medicine

## 2020-11-08 ENCOUNTER — Other Ambulatory Visit: Payer: Self-pay

## 2020-11-08 ENCOUNTER — Other Ambulatory Visit: Payer: Self-pay | Admitting: Family Medicine

## 2020-11-08 ENCOUNTER — Ambulatory Visit (INDEPENDENT_AMBULATORY_CARE_PROVIDER_SITE_OTHER): Payer: 59 | Admitting: Family Medicine

## 2020-11-08 VITALS — BP 142/88 | HR 88 | Temp 98.7°F | Resp 16 | Ht 69.0 in | Wt 298.0 lb

## 2020-11-08 DIAGNOSIS — E559 Vitamin D deficiency, unspecified: Secondary | ICD-10-CM | POA: Diagnosis not present

## 2020-11-08 DIAGNOSIS — I10 Essential (primary) hypertension: Secondary | ICD-10-CM

## 2020-11-08 DIAGNOSIS — E7800 Pure hypercholesterolemia, unspecified: Secondary | ICD-10-CM

## 2020-11-08 DIAGNOSIS — Z0000 Encounter for general adult medical examination without abnormal findings: Secondary | ICD-10-CM

## 2020-11-08 DIAGNOSIS — E1169 Type 2 diabetes mellitus with other specified complication: Secondary | ICD-10-CM

## 2020-11-08 DIAGNOSIS — Z0001 Encounter for general adult medical examination with abnormal findings: Secondary | ICD-10-CM

## 2020-11-08 MED ORDER — OZEMPIC (0.25 OR 0.5 MG/DOSE) 2 MG/1.5ML ~~LOC~~ SOPN: 0.5000 mg | PEN_INJECTOR | SUBCUTANEOUS | 2 refills | Status: DC

## 2020-11-08 NOTE — Progress Notes (Signed)
   Subjective:    Patient ID: James Murphy, male    DOB: [birth year], 54 y.o.   MRN: [id number]  Patient presents for Annual Exam (is fasting)   Pt here for CPE, meds and history reviewed  Immunizations UTD, including COVID-19 and Flu  DM- last A1C 6.7% in July , taking ozempic 0.25mg  weekly and metformin 500mg  once a day   CBG he has not checked   he is not craving sugar as much He eats breakfast, oftens skips lunch, will eat nabs, he eats dinner   [place] UTD PSA  Normal in July 2021   Hyperlipidema taking pravastatin   HTN- taking bp meds, norvasc, hctz, lotensin  , the HCTZ was added at last visit in Sept due to high BP He took meds 30 minutes ago  Eye Exam December 3rd in [facility] 01-03-2002   He is not using CPAP, can not find comfortable mask, and he refuses to pay for more mask at this time   Review Of Systems:  GEN- denies fatigue, fever, weight loss,weakness, recent illness HEENT- denies eye drainage, change in vision, nasal discharge, CVS- denies chest pain, palpitations RESP- denies SOB, cough, wheeze ABD- denies N/V, change in stools, abd pain GU- denies dysuria, hematuria, dribbling, incontinence MSK- denies joint pain, muscle aches, injury Neuro- denies headache, dizziness, syncope, seizure activity       Objective:    BP (!) 142/88   Pulse 88   Temp 98.7 F (37.1 C) (Temporal)   Resp 16   Ht 5\' 9"  (1.753 m)   Wt 298 lb (135.2 kg)   SpO2 96%   BMI 44.01 kg/m  GEN- NAD, alert and oriented x3 HEENT- PERRL, EOMI, non injected sclera, pink conjunctiva, MMM, oropharynx clear, TM clear, no effusion, nares congested  Neck- Supple, no thyromegaly CVS- RRR, no murmur RESP-CTAB ABD-NABS,soft,NT,ND Psych normal affect and mood  EXT- No edema Pulses- Radial, DP- 2+    FALL/AUDIT C/Depression screen negative     Assessment & Plan:      Problem List Items Addressed This Visit      Unprioritized   Diabetes (HCC)    Recheck A1C , goal  <  7% Increase ozempic to 0.5mg  weekly Decrease nabs/crackers, he has cut out other sugars Continue metformin       Relevant Medications   Semaglutide,0.25 or 0.5MG /DOS, (OZEMPIC, 0.25 OR 0.5 MG/DOSE,) 2 MG/1.5ML SOPN   Other Relevant Orders   Hemoglobin A1c   Lipid panel   Hyperlipidemia    Continue statin drug       Relevant Orders   Lipid panel   Hypertension    bp elevated, recently took all meds He is under stress caring for his mother who has been in and out of the hospital and rehab      Relevant Orders   CBC with Differential/Platelet   Comprehensive metabolic panel   Vitamin D deficiency    Recheck vitamin D       Relevant Orders   Vitamin D, 25-hydroxy    Other Visit Diagnoses    Routine general medical examination at a health care facility    -  Primary   CPE done, shingles at pharmacy, fasting labs today       Note: This dictation was prepared with Dragon dictation along with smaller phrase technology. Any transcriptional errors that result from this process are unintentional.

## 2020-11-08 NOTE — Assessment & Plan Note (Signed)
Recheck vitamin D

## 2020-11-08 NOTE — Assessment & Plan Note (Signed)
Continue statin drug   

## 2020-11-08 NOTE — Assessment & Plan Note (Signed)
Recheck A1C , goal  < 7% Increase ozempic to 0.5mg  weekly Decrease nabs/crackers, he has cut out other sugars Continue metformin

## 2020-11-08 NOTE — Assessment & Plan Note (Signed)
bp elevated, recently took all meds He is under stress caring for his mother who has been in and out of the hospital and rehab

## 2020-11-08 NOTE — Patient Instructions (Addendum)
F/U  4 months  Increase ozempic to 0.5 weekly

## 2020-11-09 LAB — COMPREHENSIVE METABOLIC PANEL
AG Ratio: 1.5 (calc) (ref 1.0–2.5)
ALT: 18 U/L (ref 9–46)
AST: 15 U/L (ref 10–35)
Albumin: 4.4 g/dL (ref 3.6–5.1)
Alkaline phosphatase (APISO): 42 U/L (ref 35–144)
BUN/Creatinine Ratio: 13 (calc) (ref 6–22)
BUN: 18 mg/dL (ref 7–25)
CO2: 29 mmol/L (ref 20–32)
Calcium: 9.5 mg/dL (ref 8.6–10.3)
Chloride: 105 mmol/L (ref 98–110)
Creat: 1.41 mg/dL — ABNORMAL HIGH (ref 0.70–1.33)
Globulin: 2.9 g/dL (calc) (ref 1.9–3.7)
Glucose, Bld: 96 mg/dL (ref 65–99)
Potassium: 4.4 mmol/L (ref 3.5–5.3)
Sodium: 141 mmol/L (ref 135–146)
Total Bilirubin: 0.3 mg/dL (ref 0.2–1.2)
Total Protein: 7.3 g/dL (ref 6.1–8.1)

## 2020-11-09 LAB — CBC WITH DIFFERENTIAL/PLATELET
Absolute Monocytes: 748 cells/uL (ref 200–950)
Basophils Absolute: 71 cells/uL (ref 0–200)
Basophils Relative: 0.8 %
Eosinophils Absolute: 249 cells/uL (ref 15–500)
Eosinophils Relative: 2.8 %
HCT: 42.2 % (ref 38.5–50.0)
Hemoglobin: 13.6 g/dL (ref 13.2–17.1)
Lymphs Abs: 5643 cells/uL — ABNORMAL HIGH (ref 850–3900)
MCH: 29.2 pg (ref 27.0–33.0)
MCHC: 32.2 g/dL (ref 32.0–36.0)
MCV: 90.6 fL (ref 80.0–100.0)
MPV: 10.2 fL (ref 7.5–12.5)
Monocytes Relative: 8.4 %
Neutro Abs: 2189 cells/uL (ref 1500–7800)
Neutrophils Relative %: 24.6 %
Platelets: 339 10*3/uL (ref 140–400)
RBC: 4.66 10*6/uL (ref 4.20–5.80)
RDW: 13.2 % (ref 11.0–15.0)
Total Lymphocyte: 63.4 %
WBC: 8.9 10*3/uL (ref 3.8–10.8)

## 2020-11-09 LAB — HEMOGLOBIN A1C
Hgb A1c MFr Bld: 6.7 % of total Hgb — ABNORMAL HIGH (ref <5.7)
Mean Plasma Glucose: 146 (calc)
eAG (mmol/L): 8.1 (calc)

## 2020-11-09 LAB — LIPID PANEL
Cholesterol: 138 mg/dL (ref <200)
HDL: 34 mg/dL — ABNORMAL LOW (ref >=40)
LDL Cholesterol (Calc): 89 mg/dL (calc)
Non-HDL Cholesterol (Calc): 104 mg/dL (calc) (ref <130)
Total CHOL/HDL Ratio: 4.1 (calc) (ref <5.0)
Triglycerides: 65 mg/dL (ref <150)

## 2020-11-09 LAB — VITAMIN D 25 HYDROXY (VIT D DEFICIENCY, FRACTURES): Vit D, 25-Hydroxy: 16 ng/mL — ABNORMAL LOW (ref 30–100)

## 2020-11-10 ENCOUNTER — Other Ambulatory Visit: Payer: Self-pay

## 2020-11-10 DIAGNOSIS — E559 Vitamin D deficiency, unspecified: Secondary | ICD-10-CM

## 2020-11-10 MED ORDER — VITAMIN D (ERGOCALCIFEROL) 1.25 MG (50000 UNIT) PO CAPS: 50000.0000 [IU] | ORAL_CAPSULE | ORAL | 0 refills | Status: DC

## 2020-11-10 MED FILL — VIT D2 1.25 MG (50,000 UNIT: 1.25 MG | 84 days supply | Qty: 12 | Fill #0

## 2020-11-24 DIAGNOSIS — Z7984 Long term (current) use of oral hypoglycemic drugs: Secondary | ICD-10-CM | POA: Diagnosis not present

## 2020-11-24 DIAGNOSIS — E119 Type 2 diabetes mellitus without complications: Secondary | ICD-10-CM | POA: Diagnosis not present

## 2020-11-24 DIAGNOSIS — H52223 Regular astigmatism, bilateral: Secondary | ICD-10-CM | POA: Diagnosis not present

## 2020-11-24 DIAGNOSIS — H524 Presbyopia: Secondary | ICD-10-CM | POA: Diagnosis not present

## 2020-11-24 DIAGNOSIS — H5203 Hypermetropia, bilateral: Secondary | ICD-10-CM | POA: Diagnosis not present

## 2020-11-24 LAB — HM DIABETES EYE EXAM

## 2020-12-12 MED FILL — OZEMPIC 0.25 OR 0.5 MG/DOSE: 2 | 56 days supply | Qty: 2 | Fill #2

## 2021-01-25 ENCOUNTER — Other Ambulatory Visit: Payer: Self-pay | Admitting: Family Medicine

## 2021-01-25 MED FILL — AMLODIPINE BESYLATE 10 MG T: 10 | 90 days supply | Qty: 90 | Fill #2

## 2021-01-25 MED FILL — BENAZEPRIL HCL 40 MG TABLET: 40 | 90 days supply | Qty: 90 | Fill #2

## 2021-01-25 MED FILL — METFORMIN HCL 500 MG TABS: 500 | 90 days supply | Qty: 90 | Fill #3

## 2021-01-25 MED FILL — OZEMPIC 0.25 OR 0.5 MG/DOSE: 2 | 84 days supply | Qty: 5 | Fill #0

## 2021-01-25 MED FILL — PRAVASTATIN NA 40 MG TAB: 40 | 90 days supply | Qty: 90 | Fill #0

## 2021-03-13 ENCOUNTER — Ambulatory Visit: Payer: 59 | Admitting: Family Medicine

## 2021-03-19 ENCOUNTER — Other Ambulatory Visit (HOSPITAL_COMMUNITY): Payer: Self-pay | Admitting: Nurse Practitioner

## 2021-03-19 ENCOUNTER — Other Ambulatory Visit: Payer: Self-pay

## 2021-03-19 ENCOUNTER — Telehealth: Payer: 59 | Admitting: Nurse Practitioner

## 2021-03-19 DIAGNOSIS — J069 Acute upper respiratory infection, unspecified: Secondary | ICD-10-CM | POA: Diagnosis not present

## 2021-03-19 MED ORDER — FLUTICASONE PROPIONATE 50 MCG/ACT NA SUSP: 2.0000 | Freq: Every day | NASAL | 6 refills | Status: DC

## 2021-03-19 MED ORDER — GUAIFENESIN ER 600 MG PO TB12: 600.0000 mg | ORAL_TABLET | Freq: Two times a day (BID) | ORAL | 0 refills | Status: DC | PRN

## 2021-03-19 MED ORDER — SALINE SPRAY 0.65 % NA SOLN: 1.0000 | NASAL | 0 refills | Status: DC | PRN

## 2021-03-19 MED FILL — FLUTICASONE PROP 50 MCG SPR: 50 | 30 days supply | Qty: 16 | Fill #0

## 2021-03-19 NOTE — Progress Notes (Signed)
Subjective:    Patient ID: James Murphy, male    DOB: 1966-01-01, 55 y.o.   MRN: [id number]  HPI: Jareth Pardee is a 55 y.o. male presenting virtually for throat drainage, cough, and headache.  Chief Complaint  Patient presents with  . Sinus Problem    Pt feels this has nothing to do with covid, for the past 4 days having drainage in the throat, cough and headache. Taking otc for the sx, nothing is working   UPPER RESPIRATORY TRACT INFECTION Onset: 4 days ago COVID testing history:  COVID vaccination status:  Fever: 99 Cough: yes - productive Shortness of breath: no Wheezing: no Chest pain: no Chest tightness: no Chest congestion: yes Nasal congestion: yes Runny nose: yes Post nasal drip: yes Sneezing: yes Sore throat: yes Swollen glands: no Sinus pressure: yes Headache: at first, resolved now Face pain: no Toothache: no Ear pain: no  Ear pressure: no  Eyes red/itching:yes Eye drainage/crusting: no  Nausea: no  Vomiting: no Diarrhea: no  Change in appetite: yes; decreased appetite  Loss of taste/smell: no  Rash: no Fatigue: yes  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 amLODipine (NORVASC) 10 MG tablet TAKE 1 TABLET BY MOUTH DAILY.  Marland Kitchen aspirin 81 MG tablet Take 81 mg by mouth daily.  . benazepril (LOTENSIN) 40 MG tablet TAKE 1 TABLET BY MOUTH DAILY.  Marland Kitchen Blood Glucose Monitoring Suppl (BLOOD GLUCOSE SYSTEM PAK) KIT Please dispense based on patient and insurance preference. Use as directed to monitor FSBS 3x weekly. Dx: E11.9.  Marland Kitchen fluticasone (FLONASE) 50 MCG/ACT nasal spray Place 2 sprays into both nostrils daily.  . Glucose Blood (BLOOD GLUCOSE TEST STRIPS) STRP Please dispense based on  patient and insurance preference. Use as directed to monitor FSBS 3x weekly. Dx: E11.9.  . guaiFENesin (MUCINEX) 600 MG 12 hr tablet Take 1 tablet (600 mg total) by mouth 2 (two) times daily as needed for cough or to loosen phlegm. Take instead of Robitussin  . hydrochlorothiazide (MICROZIDE) 12.5 MG capsule Take 1 capsule (12.5 mg total) by mouth daily.  . Lancets MISC Please dispense based on patient and insurance preference. Use as directed to monitor FSBS 3x weekly. Dx: E11.9.  . metFORMIN (GLUCOPHAGE) 500 MG tablet TAKE 1 TABLET (500 MG TOTAL) BY MOUTH DAILY WITH BREAKFAST.  Marland Kitchen pravastatin (PRAVACHOL) 40 MG tablet TAKE 1 TABLET BY MOUTH DAILY.  Marland Kitchen Semaglutide,0.25 or 0.5MG/DOS, (OZEMPIC, 0.25 OR 0.5 MG/DOSE,) 2 MG/1.5ML SOPN Inject 0.5 mg into the skin once a week.  . sodium chloride (OCEAN) 0.65 % SOLN nasal spray Place 1 spray into both nostrils as needed for congestion.  . Vitamin D, Ergocalciferol, (DRISDOL) 1.25 MG (50000 UNIT) CAPS capsule Take 1 capsule (50,000 Units total) by mouth every 7 (seven) days.  Marland Kitchen SHINGRIX injection    No facility-administered encounter medications on file as of 03/19/2021.    Patient Active Problem List   Diagnosis Date Noted  . OSA (obstructive sleep apnea) 07/03/2020  . Seasonal allergies 04/01/2016  . Elevated uric acid in blood 04/14/2015  . Bilateral foot pain 03/01/2015  . Abnormality of gait 03/01/2015  . Pes planus 03/01/2015  . Heel pain 12/12/2014  . Proteinuria 09/06/2013  . Murmur 01/12/2013  . Abnormal ECG  01/12/2013  . Hypertension   . Diabetes ([place])   . Obesity   . Hyperlipidemia   . Vitamin D deficiency     Past Medical History:  Diagnosis Date  . Diabetes ([place])   . Diabetes mellitus without complication ([place])    Phreesia 03/19/2021  . Hyperlipidemia   . Hypertension   . Obesity   . Vitamin D deficiency     Relevant past medical, surgical, family and social history reviewed and updated as indicated. Interim medical  history since our last visit reviewed.  Review of Systems Per HPI unless specifically indicated above     Objective:    There were no vitals taken for this visit.  Wt Readings from Last 3 Encounters:  11/08/20 298 lb (135.2 kg)  09/12/20 (!) 302 lb (137 kg)  07/03/20 298 lb (135.2 kg)    Physical Exam Physical examination unable to be performed due to lack of equipment.  Patient talking in complete sentences with occasional congested cough during telemedicine visit.    Assessment & Plan:  1. Upper respiratory tract infection, unspecified type Acute x 4 days.  Encouraged COVID testing.  Reassured patient that symptoms and exam findings are most consistent with a viral upper respiratory infection and explained lack of efficacy of antibiotics against viruses.  Discussed expected course and features suggestive of secondary bacterial infection.  Continue supportive care. Increase fluid intake with water or electrolyte solution like pedialyte. Encouraged acetaminophen as needed for fever/pain. Encouraged salt water gargling, chloraseptic spray and throat lozenges. Encouraged OTC guaifenesin. Encouraged saline sinus flushes and/or neti with humidified air.  Can take OTC Coricidin products given history of diabetes and hypertension.  If symptoms persist >10 more days, return to clinic for re-evaluation.  With any sudden onset new chest pain, dizziness, sweating, or shortness of breath, go to ED.  - guaiFENesin (MUCINEX) 600 MG 12 hr tablet; Take 1 tablet (600 mg total) by mouth 2 (two) times daily as needed for cough or to loosen phlegm. Take instead of Robitussin  Dispense: 30 tablet; Refill: 0 - fluticasone (FLONASE) 50 MCG/ACT nasal spray; Place 2 sprays into both nostrils daily.  Dispense: 16 g; Refill: 6 - sodium chloride (OCEAN) 0.65 % SOLN nasal spray; Place 1 spray into both nostrils as needed for congestion.  Dispense: 88 mL; Refill: 0   Follow up plan: Return if symptoms worsen or fail  to improve.  This visit was completed via telephone due to the restrictions of the COVID-19 pandemic. All issues as above were discussed and addressed but no physical exam was performed. If it was felt that the patient should be evaluated in the office, they were directed there. The patient verbally consented to this visit. Patient was unable to complete an audio/visual visit due to Technical difficulties.  Caregility invitation sent and patient reports he was connected to visit.   However, I was unable to visualize. . Location of the patient: home . Location of the provider: work . Those involved with this call:  . Provider: Noemi Chapel, DNP, FNP-C . CMA: Annabelle Harman, CMA . Front Desk/Registration: Vevelyn Pat  . Time spent on call: 8 minutes on the phone discussing health concerns. 15 minutes total spent in review of patient's record and preparation of their chart.  I verified patient identity using two factors (patient name and date of birth). Patient consents verbally to being seen via telemedicine visit today.

## 2021-03-24 ENCOUNTER — Other Ambulatory Visit (HOSPITAL_COMMUNITY): Payer: Self-pay

## 2021-04-25 ENCOUNTER — Other Ambulatory Visit (HOSPITAL_COMMUNITY): Payer: Self-pay

## 2021-04-25 ENCOUNTER — Other Ambulatory Visit: Payer: Self-pay | Admitting: Family Medicine

## 2021-04-25 MED ORDER — METFORMIN HCL 500 MG PO TABS
500.0000 mg | ORAL_TABLET | Freq: Every day | ORAL | 0 refills | Status: DC
  Filled 2021-04-25: qty 90, 90d supply, fill #0

## 2021-04-25 MED ORDER — OZEMPIC (0.25 OR 0.5 MG/DOSE) 2 MG/1.5ML ~~LOC~~ SOPN
0.5000 mg | PEN_INJECTOR | SUBCUTANEOUS | 0 refills | Status: DC
  Filled 2021-04-25: qty 1.5, 28d supply, fill #0

## 2021-04-25 MED FILL — Benazepril HCl Tab 40 MG: ORAL | 90 days supply | Qty: 90 | Fill #0 | Status: AC

## 2021-04-25 MED FILL — Amlodipine Besylate Tab 10 MG (Base Equivalent): ORAL | 90 days supply | Qty: 90 | Fill #0 | Status: AC

## 2021-04-25 MED FILL — Pravastatin Sodium Tab 40 MG: ORAL | 90 days supply | Qty: 90 | Fill #0 | Status: AC

## 2021-04-26 ENCOUNTER — Other Ambulatory Visit (HOSPITAL_COMMUNITY): Payer: Self-pay

## 2021-05-09 ENCOUNTER — Encounter: Payer: Self-pay | Admitting: Internal Medicine

## 2021-05-09 ENCOUNTER — Ambulatory Visit: Payer: 59 | Admitting: Internal Medicine

## 2021-05-09 ENCOUNTER — Other Ambulatory Visit: Payer: Self-pay

## 2021-05-09 ENCOUNTER — Other Ambulatory Visit (HOSPITAL_COMMUNITY): Payer: Self-pay

## 2021-05-09 VITALS — BP 138/89 | HR 84 | Temp 98.2°F | Resp 18 | Ht 69.0 in | Wt 286.1 lb

## 2021-05-09 DIAGNOSIS — Z7689 Persons encountering health services in other specified circumstances: Secondary | ICD-10-CM | POA: Diagnosis not present

## 2021-05-09 DIAGNOSIS — E1169 Type 2 diabetes mellitus with other specified complication: Secondary | ICD-10-CM | POA: Diagnosis not present

## 2021-05-09 DIAGNOSIS — G4733 Obstructive sleep apnea (adult) (pediatric): Secondary | ICD-10-CM | POA: Diagnosis not present

## 2021-05-09 DIAGNOSIS — I10 Essential (primary) hypertension: Secondary | ICD-10-CM

## 2021-05-09 DIAGNOSIS — Z6841 Body Mass Index (BMI) 40.0 and over, adult: Secondary | ICD-10-CM

## 2021-05-09 DIAGNOSIS — E7800 Pure hypercholesterolemia, unspecified: Secondary | ICD-10-CM

## 2021-05-09 LAB — POCT GLYCOSYLATED HEMOGLOBIN (HGB A1C)
HbA1c, POC (controlled diabetic range): 6.2 % (ref 0.0–7.0)
HbA1c, POC (prediabetic range): 6.2 % (ref 5.7–6.4)

## 2021-05-09 MED ORDER — AMLODIPINE BESYLATE 10 MG PO TABS
10.0000 mg | ORAL_TABLET | Freq: Every day | ORAL | 3 refills | Status: DC
  Filled 2021-05-09 – 2021-07-24 (×2): qty 90, 90d supply, fill #0
  Filled 2021-12-27: qty 90, 90d supply, fill #1
  Filled 2022-03-26: qty 90, 90d supply, fill #2

## 2021-05-09 MED ORDER — METFORMIN HCL 500 MG PO TABS
500.0000 mg | ORAL_TABLET | Freq: Every day | ORAL | 1 refills | Status: DC
  Filled 2021-05-09 – 2021-07-24 (×2): qty 90, 90d supply, fill #0
  Filled 2021-12-27: qty 90, 90d supply, fill #1

## 2021-05-09 MED ORDER — PRAVASTATIN SODIUM 40 MG PO TABS
40.0000 mg | ORAL_TABLET | Freq: Every day | ORAL | 2 refills | Status: DC
  Filled 2021-05-09 – 2021-07-24 (×2): qty 90, 90d supply, fill #0
  Filled 2021-12-27: qty 90, 90d supply, fill #1
  Filled 2022-03-26: qty 90, 90d supply, fill #2

## 2021-05-09 MED ORDER — OZEMPIC (0.25 OR 0.5 MG/DOSE) 2 MG/1.5ML ~~LOC~~ SOPN
0.5000 mg | PEN_INJECTOR | SUBCUTANEOUS | 0 refills | Status: DC
Start: 2021-05-09 — End: 2021-11-07
  Filled 2021-05-09: qty 4.5, 84d supply, fill #0

## 2021-05-09 MED ORDER — HYDROCHLOROTHIAZIDE 12.5 MG PO CAPS
12.5000 mg | ORAL_CAPSULE | Freq: Every day | ORAL | 1 refills | Status: DC
  Filled 2021-05-09: qty 90, 90d supply, fill #0

## 2021-05-09 MED ORDER — BENAZEPRIL HCL 40 MG PO TABS
40.0000 mg | ORAL_TABLET | Freq: Every day | ORAL | 3 refills | Status: DC
  Filled 2021-05-09 – 2021-07-24 (×2): qty 90, 90d supply, fill #0
  Filled 2021-12-27: qty 90, 90d supply, fill #1
  Filled 2022-03-26: qty 90, 90d supply, fill #2

## 2021-05-09 NOTE — Assessment & Plan Note (Signed)
Diet modification and moderate exercise/walking at least 150 mins/week On Ozempic, 12 lbs weight loss since 10/2020

## 2021-05-09 NOTE — Assessment & Plan Note (Signed)
On Pravastatin 

## 2021-05-09 NOTE — Assessment & Plan Note (Signed)
Does not use CPAP as masks don't fit. Planning to get nasal pillow. Discussed about possible alternatives including dental procedure as treatment.

## 2021-05-09 NOTE — Assessment & Plan Note (Signed)
BP Readings from Last 1 Encounters:  05/09/21 138/89   Well-controlled with Amlodipine, Benazepril and HCTZ Counseled for compliance with the medications Advised DASH diet and moderate exercise/walking, at least 150 mins/week

## 2021-05-09 NOTE — Assessment & Plan Note (Signed)
HbA1C: 6.2 today in office On Metformin and Ozempic Advised to follow diabetic diet On statin and ACEi F/u CMP and lipid panel Diabetic eye exam: Advised to follow up with Ophthalmology for diabetic eye exam

## 2021-05-09 NOTE — Patient Instructions (Addendum)
Please continue taking medications as prescribed.  Please continue to follow low carb diet and perform moderate exercise/walking at least 150 mins/week.   [email or web address]">  DASH Eating Plan DASH stands for Dietary Approaches to Stop Hypertension. The DASH eating plan is a healthy eating plan that has been shown to:  Reduce high blood pressure (hypertension).  Reduce your risk for type 2 diabetes, heart disease, and stroke.  Help with weight loss. What are tips for following this plan? Reading food labels  Check food labels for the amount of salt (sodium) per serving. Choose foods with less than 5 percent of the Daily Value of sodium. Generally, foods with less than 300 milligrams (mg) of sodium per serving fit into this eating plan.  To find whole grains, look for the word "whole" as the first word in the ingredient list. Shopping  Buy products labeled as "low-sodium" or "no salt added."  Buy fresh foods. Avoid canned foods and pre-made or frozen meals. Cooking  Avoid adding salt when cooking. Use salt-free seasonings or herbs instead of table salt or sea salt. Check with your health care provider or pharmacist before using salt substitutes.  Do not fry foods. Cook foods using healthy methods such as baking, boiling, grilling, roasting, and broiling instead.  Cook with heart-healthy oils, such as olive, canola, avocado, soybean, or sunflower oil. Meal planning  Eat a balanced diet that includes: ? 4 or more servings of fruits and 4 or more servings of vegetables each day. Try to fill one-half of your plate with fruits and vegetables. ? 6-8 servings of whole grains each day. ? Less than 6 oz (170 g) of lean meat, poultry, or fish each day. A 3-oz (85-g) serving of meat is about the same size as a deck of cards. One egg equals 1 oz (28 g). ? 2-3 servings of low-fat dairy each day. One serving is 1 cup (237 mL). ? 1 serving of  nuts, seeds, or beans 5 times each week. ? 2-3 servings of heart-healthy fats. Healthy fats called omega-3 fatty acids are found in foods such as walnuts, flaxseeds, fortified milks, and eggs. These fats are also found in cold-water fish, such as sardines, salmon, and mackerel.  Limit how much you eat of: ? Canned or prepackaged foods. ? Food that is high in trans fat, such as some fried foods. ? Food that is high in saturated fat, such as fatty meat. ? Desserts and other sweets, sugary drinks, and other foods with added sugar. ? Full-fat dairy products.  Do not salt foods before eating.  Do not eat more than 4 egg yolks a week.  Try to eat at least 2 vegetarian meals a week.  Eat more home-cooked food and less restaurant, buffet, and fast food.   Lifestyle  When eating at a restaurant, ask that your food be prepared with less salt or no salt, if possible.  If you drink alcohol: ? Limit how much you use to:  0-1 drink a day for women who are not pregnant.  0-2 drinks a day for men. ? Be aware of how much alcohol is in your drink. In the [place] one drink equals one 12 oz bottle of beer (355 mL), one 5 oz glass of wine (148 mL), or one 1 oz glass of hard liquor (44 mL). General information  Avoid eating more than 2,300 mg of salt a day. If you have hypertension, you may need to reduce your sodium intake to 1,500 mg a day.  Work with your health care provider to maintain a healthy body weight or to lose weight. Ask what an ideal weight is for you.  Get at least 30 minutes of exercise that causes your heart to beat faster (aerobic exercise) most days of the week. Activities may include walking, swimming, or biking.  Work with your health care provider or dietitian to adjust your eating plan to your individual calorie needs. What foods should I eat? Fruits All fresh, dried, or frozen fruit. Canned fruit in natural juice (without added sugar). Vegetables Fresh or frozen vegetables  (raw, steamed, roasted, or grilled). Low-sodium or reduced-sodium tomato and vegetable juice. Low-sodium or reduced-sodium tomato sauce and tomato paste. Low-sodium or reduced-sodium canned vegetables. Grains Whole-grain or whole-wheat bread. Whole-grain or whole-wheat pasta. Brown rice. [place]. Bulgur. Whole-grain and low-sodium cereals. Pita bread. Low-fat, low-sodium crackers. Whole-wheat flour tortillas. Meats and other proteins Skinless chicken or [place]. Ground chicken or [place]. Pork with fat trimmed off. Fish and seafood. Egg whites. Dried beans, peas, or lentils. Unsalted nuts, nut butters, and seeds. Unsalted canned beans. Lean cuts of beef with fat trimmed off. Low-sodium, lean precooked or cured meat, such as sausages or meat loaves. Dairy Low-fat (1%) or fat-free (skim) milk. Reduced-fat, low-fat, or fat-free cheeses. Nonfat, low-sodium ricotta or cottage cheese. Low-fat or nonfat yogurt. Low-fat, low-sodium cheese. Fats and oils Soft margarine without trans fats. Vegetable oil. Reduced-fat, low-fat, or light mayonnaise and salad dressings (reduced-sodium). Canola, safflower, olive, avocado, soybean, and sunflower oils. Avocado. Seasonings and condiments Herbs. Spices. Seasoning mixes without salt. Other foods Unsalted popcorn and pretzels. Fat-free sweets. The items listed above may not be a complete list of foods and beverages you can eat. Contact a dietitian for more information. What foods should I avoid? Fruits Canned fruit in a light or heavy syrup. Fried fruit. Fruit in cream or butter sauce. Vegetables Creamed or fried vegetables. Vegetables in a cheese sauce. Regular canned vegetables (not low-sodium or reduced-sodium). Regular canned tomato sauce and paste (not low-sodium or reduced-sodium). Regular tomato and vegetable juice (not low-sodium or reduced-sodium). Angie Fava. Olives. Grains Baked goods made with fat, such as croissants, muffins, or some breads. Dry pasta  or rice meal packs. Meats and other proteins Fatty cuts of meat. Ribs. Fried meat. Berniece Salines. Bologna, salami, and other precooked or cured meats, such as sausages or meat loaves. Fat from the back of a pig (fatback). Bratwurst. Salted nuts and seeds. Canned beans with added salt. Canned or smoked fish. Whole eggs or egg yolks. Chicken or [place] with skin. Dairy Whole or 2% milk, cream, and half-and-half. Whole or full-fat cream cheese. Whole-fat or sweetened yogurt. Full-fat cheese. Nondairy creamers. Whipped toppings. Processed cheese and cheese spreads. Fats and oils Butter. Stick margarine. Lard. Shortening. Ghee. Bacon fat. Tropical oils, such as coconut, palm kernel, or palm oil. Seasonings and condiments Onion salt, garlic salt, seasoned salt, table salt, and sea salt. Worcestershire sauce. Tartar sauce. Barbecue sauce. Teriyaki sauce. Soy sauce, including reduced-sodium. Steak sauce. Canned and packaged gravies. Fish sauce. Oyster sauce. Cocktail sauce. Store-bought horseradish. Ketchup. Mustard. Meat flavorings and tenderizers. Bouillon cubes. Hot sauces. Pre-made or packaged marinades. Pre-made or packaged taco seasonings. Relishes. Regular salad dressings. Other foods Salted popcorn and pretzels. The items listed above may not be a complete list of foods and beverages you should avoid. Contact a dietitian for more information. Where to find more information  National Heart, Lung, and Blood Institute: https://wilson-eaton.com/  American Heart Association: www.heart.org  Academy of Nutrition and  Dietetics: www.eatright.[place]: www.kidney.org Summary  The DASH eating plan is a healthy eating plan that has been shown to reduce high blood pressure (hypertension). It may also reduce your risk for type 2 diabetes, heart disease, and stroke.  When on the DASH eating plan, aim to eat more fresh fruits and vegetables, whole grains, lean proteins, low-fat dairy, and  heart-healthy fats.  With the DASH eating plan, you should limit salt (sodium) intake to 2,300 mg a day. If you have hypertension, you may need to reduce your sodium intake to 1,500 mg a day.  Work with your health care provider or dietitian to adjust your eating plan to your individual calorie needs. This information is not intended to replace advice given to you by your health care provider. Make sure you discuss any questions you have with your health care provider. Document Revised: 11/12/2019 Document Reviewed: 11/12/2019 Elsevier Patient Education  2021 [employer].
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
## 2021-05-10 ENCOUNTER — Encounter: Payer: Self-pay | Admitting: *Deleted

## 2021-05-10 LAB — CMP14+EGFR
ALT: 18 IU/L (ref 0–44)
AST: 15 IU/L (ref 0–40)
Albumin/Globulin Ratio: 1.6 (ref 1.2–2.2)
Albumin: 4.6 g/dL (ref 3.8–4.9)
Alkaline Phosphatase: 55 IU/L (ref 44–121)
BUN/Creatinine Ratio: 14 (ref 9–20)
BUN: 16 mg/dL (ref 6–24)
Bilirubin Total: 0.3 mg/dL (ref 0.0–1.2)
CO2: 23 mmol/L (ref 20–29)
Calcium: 9.9 mg/dL (ref 8.7–10.2)
Chloride: 101 mmol/L (ref 96–106)
Creatinine, Ser: 1.18 mg/dL (ref 0.76–1.27)
Globulin, Total: 2.8 g/dL (ref 1.5–4.5)
Glucose: 91 mg/dL (ref 65–99)
Potassium: 4.5 mmol/L (ref 3.5–5.2)
Sodium: 139 mmol/L (ref 134–144)
Total Protein: 7.4 g/dL (ref 6.0–8.5)
eGFR: 74 mL/min/{1.73_m2} (ref >59)

## 2021-05-10 LAB — CBC WITH DIFFERENTIAL/PLATELET
Basophils Absolute: 0.1 10*3/uL (ref 0.0–0.2)
Basos: 1 %
EOS (ABSOLUTE): 0.3 10*3/uL (ref 0.0–0.4)
Eos: 4 %
Hematocrit: 43.3 % (ref 37.5–51.0)
Hemoglobin: 14 g/dL (ref 13.0–17.7)
Immature Grans (Abs): 0 10*3/uL (ref 0.0–0.1)
Immature Granulocytes: 0 %
Lymphocytes Absolute: 4.5 10*3/uL — ABNORMAL HIGH (ref 0.7–3.1)
Lymphs: 60 %
MCH: 28.6 pg (ref 26.6–33.0)
MCHC: 32.3 g/dL (ref 31.5–35.7)
MCV: 89 fL (ref 79–97)
Monocytes Absolute: 0.6 10*3/uL (ref 0.1–0.9)
Monocytes: 8 %
Neutrophils Absolute: 2 10*3/uL (ref 1.4–7.0)
Neutrophils: 27 %
Platelets: 384 10*3/uL (ref 150–450)
RBC: 4.89 x10E6/uL (ref 4.14–5.80)
RDW: 13.4 % (ref 11.6–15.4)
WBC: 7.5 10*3/uL (ref 3.4–10.8)

## 2021-05-10 LAB — LIPID PANEL
Chol/HDL Ratio: 3.7 ratio (ref 0.0–5.0)
Cholesterol, Total: 146 mg/dL (ref 100–199)
HDL: 40 mg/dL (ref >39)
LDL Chol Calc (NIH): 93 mg/dL (ref 0–99)
Triglycerides: 66 mg/dL (ref 0–149)
VLDL Cholesterol Cal: 13 mg/dL (ref 5–40)

## 2021-05-10 LAB — TSH: TSH: 1.34 u[IU]/mL (ref 0.450–4.500)

## 2021-05-17 ENCOUNTER — Other Ambulatory Visit (HOSPITAL_COMMUNITY): Payer: Self-pay

## 2021-06-09 DIAGNOSIS — Z20822 Contact with and (suspected) exposure to covid-19: Secondary | ICD-10-CM | POA: Diagnosis not present

## 2021-06-09 DIAGNOSIS — J329 Chronic sinusitis, unspecified: Secondary | ICD-10-CM | POA: Diagnosis not present

## 2021-06-09 DIAGNOSIS — B9689 Other specified bacterial agents as the cause of diseases classified elsewhere: Secondary | ICD-10-CM | POA: Diagnosis not present

## 2021-07-24 ENCOUNTER — Other Ambulatory Visit (HOSPITAL_COMMUNITY): Payer: Self-pay

## 2021-11-07 ENCOUNTER — Other Ambulatory Visit: Payer: Self-pay

## 2021-11-07 ENCOUNTER — Other Ambulatory Visit (HOSPITAL_COMMUNITY): Payer: Self-pay

## 2021-11-07 ENCOUNTER — Ambulatory Visit (INDEPENDENT_AMBULATORY_CARE_PROVIDER_SITE_OTHER): Payer: 59 | Admitting: Internal Medicine

## 2021-11-07 ENCOUNTER — Encounter: Payer: Self-pay | Admitting: Internal Medicine

## 2021-11-07 VITALS — BP 129/82 | HR 78 | Temp 98.4°F | Resp 18 | Ht 69.0 in | Wt 297.1 lb

## 2021-11-07 DIAGNOSIS — E782 Mixed hyperlipidemia: Secondary | ICD-10-CM

## 2021-11-07 DIAGNOSIS — I10 Essential (primary) hypertension: Secondary | ICD-10-CM | POA: Diagnosis not present

## 2021-11-07 DIAGNOSIS — Z23 Encounter for immunization: Secondary | ICD-10-CM

## 2021-11-07 DIAGNOSIS — E1169 Type 2 diabetes mellitus with other specified complication: Secondary | ICD-10-CM

## 2021-11-07 DIAGNOSIS — Z125 Encounter for screening for malignant neoplasm of prostate: Secondary | ICD-10-CM | POA: Diagnosis not present

## 2021-11-07 DIAGNOSIS — M7989 Other specified soft tissue disorders: Secondary | ICD-10-CM

## 2021-11-07 DIAGNOSIS — Z0001 Encounter for general adult medical examination with abnormal findings: Secondary | ICD-10-CM | POA: Diagnosis not present

## 2021-11-07 DIAGNOSIS — E559 Vitamin D deficiency, unspecified: Secondary | ICD-10-CM

## 2021-11-07 MED ORDER — FUROSEMIDE 20 MG PO TABS
20.0000 mg | ORAL_TABLET | Freq: Every day | ORAL | 5 refills | Status: DC
  Filled 2021-11-07: qty 30, 30d supply, fill #0
  Filled 2021-12-27: qty 30, 30d supply, fill #1
  Filled 2022-03-26: qty 30, 30d supply, fill #2
  Filled 2022-04-24: qty 30, 30d supply, fill #3

## 2021-11-07 MED ORDER — TIRZEPATIDE 2.5 MG/0.5ML ~~LOC~~ SOAJ
2.5000 mg | SUBCUTANEOUS | 0 refills | Status: DC
  Filled 2021-11-07: qty 2, 28d supply, fill #0

## 2021-11-07 MED ORDER — TIRZEPATIDE 5 MG/0.5ML ~~LOC~~ SOAJ
5.0000 mg | SUBCUTANEOUS | 1 refills | Status: DC
  Filled 2021-11-07: qty 6, 84d supply, fill #0

## 2021-11-07 NOTE — Assessment & Plan Note (Signed)
BP Readings from Last 1 Encounters:  11/07/21 129/82   Well-controlled with Amlodipine and Benazepril Counseled for compliance with the medications Advised DASH diet and moderate exercise/walking, at least 150 mins/week

## 2021-11-07 NOTE — Assessment & Plan Note (Signed)
On Pravastatin Check lipid profile 
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
## 2021-11-07 NOTE — Assessment & Plan Note (Signed)
Annual exam as documented. Counseling done  re healthy lifestyle involving commitment to 150 minutes exercise per week, heart healthy diet, and attaining healthy weight.The importance of adequate sleep also discussed. Changes in health habits are decided on by the patient with goals and time frames  set for achieving them. Immunization and cancer screening needs are specifically addressed at this visit. 

## 2021-11-07 NOTE — Assessment & Plan Note (Signed)
Diet modification and moderate exercise/walking at least 150 mins/week Had weight loss with Ozempic, but gained after stopping it Started Mounjaro for DM and weight loss

## 2021-11-07 NOTE — Assessment & Plan Note (Signed)
Check HbA1C On Metformin, was on Ozempic, has cost concerns Switched to [employer] - coupon provided Advised to follow diabetic diet On statin and ACEi F/u CMP and lipid panel Diabetic eye exam: Advised to follow up with Ophthalmology for diabetic eye exam

## 2021-11-07 NOTE — Assessment & Plan Note (Signed)
Ordered PSA after discussing its limitations for prostate cancer screening, including false positive results leading additional investigations. 

## 2021-11-07 NOTE — Patient Instructions (Signed)
Please start taking Mounjaro once weekly as discussed - 2.5 mg for first month and then 5 mg.  Please follow low carb diet and perform moderate exercise/walking at least 150 mins/week.  Please take Lasix as needed for leg swelling. Leg elevation and compression socks can also be helpful.

## 2021-11-08 LAB — CBC WITH DIFFERENTIAL/PLATELET
Basophils Absolute: 0.1 10*3/uL (ref 0.0–0.2)
Basos: 1 %
EOS (ABSOLUTE): 0.2 10*3/uL (ref 0.0–0.4)
Eos: 2 %
Hematocrit: 42.1 % (ref 37.5–51.0)
Hemoglobin: 13.7 g/dL (ref 13.0–17.7)
Immature Grans (Abs): 0 10*3/uL (ref 0.0–0.1)
Immature Granulocytes: 0 %
Lymphocytes Absolute: 5.2 10*3/uL — ABNORMAL HIGH (ref 0.7–3.1)
Lymphs: 61 %
MCH: 28.7 pg (ref 26.6–33.0)
MCHC: 32.5 g/dL (ref 31.5–35.7)
MCV: 88 fL (ref 79–97)
Monocytes Absolute: 0.8 10*3/uL (ref 0.1–0.9)
Monocytes: 10 %
Neutrophils Absolute: 2.2 10*3/uL (ref 1.4–7.0)
Neutrophils: 26 %
Platelets: 333 10*3/uL (ref 150–450)
RBC: 4.78 x10E6/uL (ref 4.14–5.80)
RDW: 13.4 % (ref 11.6–15.4)
WBC: 8.5 10*3/uL (ref 3.4–10.8)

## 2021-11-08 LAB — CMP14+EGFR
ALT: 18 IU/L (ref 0–44)
AST: 17 IU/L (ref 0–40)
Albumin/Globulin Ratio: 1.6 (ref 1.2–2.2)
Albumin: 4.7 g/dL (ref 3.8–4.9)
Alkaline Phosphatase: 51 IU/L (ref 44–121)
BUN/Creatinine Ratio: 16 (ref 9–20)
BUN: 18 mg/dL (ref 6–24)
Bilirubin Total: 0.4 mg/dL (ref 0.0–1.2)
CO2: 23 mmol/L (ref 20–29)
Calcium: 10 mg/dL (ref 8.7–10.2)
Chloride: 102 mmol/L (ref 96–106)
Creatinine, Ser: 1.16 mg/dL (ref 0.76–1.27)
Globulin, Total: 2.9 g/dL (ref 1.5–4.5)
Glucose: 117 mg/dL — ABNORMAL HIGH (ref 70–99)
Potassium: 4.2 mmol/L (ref 3.5–5.2)
Sodium: 139 mmol/L (ref 134–144)
Total Protein: 7.6 g/dL (ref 6.0–8.5)
eGFR: 76 mL/min/1.73

## 2021-11-08 LAB — PSA: Prostate Specific Ag, Serum: 3.2 ng/mL (ref 0.0–4.0)

## 2021-11-08 LAB — TSH: TSH: 1.5 u[IU]/mL (ref 0.450–4.500)

## 2021-11-08 LAB — LIPID PANEL
Chol/HDL Ratio: 3.7 ratio (ref 0.0–5.0)
Cholesterol, Total: 144 mg/dL (ref 100–199)
HDL: 39 mg/dL — ABNORMAL LOW (ref >39)
LDL Chol Calc (NIH): 91 mg/dL (ref 0–99)
Triglycerides: 69 mg/dL (ref 0–149)
VLDL Cholesterol Cal: 14 mg/dL (ref 5–40)

## 2021-11-08 LAB — HEMOGLOBIN A1C
Est. average glucose Bld gHb Est-mCnc: 140 mg/dL
Hgb A1c MFr Bld: 6.5 % — ABNORMAL HIGH (ref 4.8–5.6)

## 2021-11-08 LAB — VITAMIN D 25 HYDROXY (VIT D DEFICIENCY, FRACTURES): Vit D, 25-Hydroxy: 23.5 ng/mL — ABNORMAL LOW (ref 30.0–100.0)

## 2021-11-09 ENCOUNTER — Encounter: Payer: 59 | Admitting: Internal Medicine

## 2021-12-27 ENCOUNTER — Other Ambulatory Visit (HOSPITAL_COMMUNITY): Payer: Self-pay

## 2022-01-02 DIAGNOSIS — H0288A Meibomian gland dysfunction right eye, upper and lower eyelids: Secondary | ICD-10-CM | POA: Diagnosis not present

## 2022-01-02 DIAGNOSIS — H5203 Hypermetropia, bilateral: Secondary | ICD-10-CM | POA: Diagnosis not present

## 2022-01-02 DIAGNOSIS — H52223 Regular astigmatism, bilateral: Secondary | ICD-10-CM | POA: Diagnosis not present

## 2022-01-02 DIAGNOSIS — H0288B Meibomian gland dysfunction left eye, upper and lower eyelids: Secondary | ICD-10-CM | POA: Diagnosis not present

## 2022-01-02 DIAGNOSIS — H524 Presbyopia: Secondary | ICD-10-CM | POA: Diagnosis not present

## 2022-01-02 DIAGNOSIS — H1045 Other chronic allergic conjunctivitis: Secondary | ICD-10-CM | POA: Diagnosis not present

## 2022-01-02 DIAGNOSIS — E119 Type 2 diabetes mellitus without complications: Secondary | ICD-10-CM | POA: Diagnosis not present

## 2022-01-02 DIAGNOSIS — Z7984 Long term (current) use of oral hypoglycemic drugs: Secondary | ICD-10-CM | POA: Diagnosis not present

## 2022-01-02 LAB — HM DIABETES EYE EXAM

## 2022-01-03 ENCOUNTER — Encounter: Payer: Self-pay | Admitting: *Deleted

## 2022-02-08 ENCOUNTER — Other Ambulatory Visit (HOSPITAL_COMMUNITY): Payer: Self-pay

## 2022-02-08 ENCOUNTER — Other Ambulatory Visit: Payer: Self-pay

## 2022-02-08 ENCOUNTER — Ambulatory Visit: Payer: 59 | Admitting: Nurse Practitioner

## 2022-02-08 ENCOUNTER — Encounter: Payer: Self-pay | Admitting: Nurse Practitioner

## 2022-02-08 VITALS — BP 152/90 | HR 94 | Ht 69.0 in | Wt 295.1 lb

## 2022-02-08 DIAGNOSIS — J309 Allergic rhinitis, unspecified: Secondary | ICD-10-CM | POA: Insufficient documentation

## 2022-02-08 DIAGNOSIS — J3089 Other allergic rhinitis: Secondary | ICD-10-CM | POA: Diagnosis not present

## 2022-02-08 DIAGNOSIS — J069 Acute upper respiratory infection, unspecified: Secondary | ICD-10-CM | POA: Insufficient documentation

## 2022-02-08 MED ORDER — SALINE NASAL SPRAY 0.65 % NA SOLN
1.0000 | NASAL | 0 refills | Status: AC | PRN
  Filled 2022-02-08: qty 88, fill #0
  Filled 2022-02-08: qty 88, 30d supply, fill #0

## 2022-02-08 MED ORDER — CETIRIZINE HCL 10 MG PO TABS
10.0000 mg | ORAL_TABLET | Freq: Every day | ORAL | 11 refills | Status: DC
  Filled 2022-02-08: qty 30, 30d supply, fill #0

## 2022-02-08 MED ORDER — FLUTICASONE PROPIONATE 50 MCG/ACT NA SUSP
2.0000 | Freq: Every day | NASAL | 6 refills | Status: AC
Start: 2022-02-08 — End: ?
  Filled 2022-02-08 – 2023-02-05 (×2): qty 16, 30d supply, fill #0

## 2022-02-08 NOTE — Assessment & Plan Note (Signed)
No sign of infection .  He took cetirizine 10mg  daily  for 3 days and stopped taking it due to no improvement. Patient encouraged to start start taking cetirizine 10 mg daily, Flonase nasal spray 2 spray daily, saline nasal spray as needed.patient encouraged to avoid allergens, states that he is allergic to mites Mold, cats.  Patient stated that he has been evaluated by an allergist in the past.

## 2022-02-08 NOTE — Progress Notes (Signed)
° °  James Murphy     MRN: [id number]      DOB: 1966-10-19   HPI James Murphy is here for c/o running nose with clear nasal drainage  watery eyes, sneezing, stuffy nose since 5 days ago. He has taken zyrtec 10mg  for 3 days  , vicks , they did not help.  He has had these symptoms before. Denies HA, sinus pressure, fever, chills, SOB , wheezing. CP palpitation. . Has several allergies including , dust mites, cats, dog hair.     ROS Denies recent fever or chills.  has nasal congestion, running nose, watery eyes, no ear pain , sinus pressure or sore throat itchy eyes, eye pain Denies chest congestion, productive cough or wheezing. Denies chest pains, palpitations and leg swelling Denies abdominal pain, nausea, vomiting,diarrhea or constipation.   Denies dysuria, frequency, hesitancy or incontinence. Denies joint pain, swelling and limitation in mobility. Denies headaches, seizures, numbness, or tingling. Denies depression, anxiety or insomnia. Denies skin break down or rash.   PE  BP (!) 152/90    Pulse 94    Ht 5\' 9"  (1.753 m)    Wt 295 lb 1.3 oz (133.8 kg)    SpO2 95%    BMI 43.58 kg/m   Patient alert and oriented and in no cardiopulmonary distress.  HEENT: No facial asymmetry, EOMI,     Neck supple .  Bilateral eyes appears watery , decreased turbinate  patency on the right nare.   Chest: Clear to auscultation bilaterally.  CVS: S1, S2 no murmurs, no S3.Regular rate.  ABD: Soft non tender.   Ext: No edema  MS: Adequate ROM spine, shoulders, hips and knees.  Skin: Intact, no ulcerations or rash noted.  Psych: Good eye contact, normal affect. Memory intact not anxious or depressed appearing.  CNS: CN 2-12 intact, power,  normal throughout.no focal deficits noted.   Assessment & Plan

## 2022-02-08 NOTE — Patient Instructions (Signed)
Take zyrtec 10mg  daily for your allergies  Flonase nasal spray , 2 spray daily for your allergies Use saline nasal spray as needed to flush your nostrils. Stay away for identified allergens.    It is important that you exercise regularly at least 30 minutes 5 times a week.  Think about what you will eat, plan ahead. Choose " clean, green, fresh or frozen" over canned, processed or packaged foods which are more sugary, salty and fatty. 70 to 75% of food eaten should be vegetables and fruit. Three meals at set times with snacks allowed between meals, but they must be fruit or vegetables. Aim to eat over a 12 hour period , example 7 am to 7 pm, and STOP after  your last meal of the day. Drink water,generally about 64 ounces per day, no other drink is as healthy. Fruit juice is best enjoyed in a healthy way, by EATING the fruit.  Thanks for choosing [facility], we consider it a privelige to serve you.

## 2022-02-11 ENCOUNTER — Ambulatory Visit: Payer: 59 | Admitting: Nurse Practitioner

## 2022-02-18 ENCOUNTER — Other Ambulatory Visit (HOSPITAL_COMMUNITY): Payer: Self-pay

## 2022-03-07 ENCOUNTER — Ambulatory Visit: Payer: 59 | Admitting: Internal Medicine

## 2022-03-21 ENCOUNTER — Ambulatory Visit: Payer: 59 | Admitting: Internal Medicine

## 2022-03-21 ENCOUNTER — Encounter: Payer: Self-pay | Admitting: Internal Medicine

## 2022-03-21 ENCOUNTER — Other Ambulatory Visit (HOSPITAL_COMMUNITY): Payer: Self-pay

## 2022-03-21 VITALS — BP 118/72 | HR 78 | Ht 69.0 in | Wt 290.4 lb

## 2022-03-21 DIAGNOSIS — G253 Myoclonus: Secondary | ICD-10-CM | POA: Diagnosis not present

## 2022-03-21 DIAGNOSIS — H6121 Impacted cerumen, right ear: Secondary | ICD-10-CM

## 2022-03-21 DIAGNOSIS — E1169 Type 2 diabetes mellitus with other specified complication: Secondary | ICD-10-CM

## 2022-03-21 DIAGNOSIS — I10 Essential (primary) hypertension: Secondary | ICD-10-CM

## 2022-03-21 DIAGNOSIS — E782 Mixed hyperlipidemia: Secondary | ICD-10-CM

## 2022-03-21 LAB — POCT GLYCOSYLATED HEMOGLOBIN (HGB A1C): HbA1c, POC (controlled diabetic range): 6.5 % (ref 0.0–7.0)

## 2022-03-21 MED ORDER — SEMAGLUTIDE (1 MG/DOSE) 4 MG/3ML ~~LOC~~ SOPN
1.0000 mg | PEN_INJECTOR | SUBCUTANEOUS | 1 refills | Status: DC
  Filled 2022-03-21: qty 9, 84d supply, fill #0
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
## 2022-03-21 NOTE — Assessment & Plan Note (Signed)
Diet modification and moderate exercise/walking at least 150 mins/week ?Had weight loss with Ozempic, but gained after stopping it, restarted Ozempic ?

## 2022-03-21 NOTE — Patient Instructions (Addendum)
Please start taking Ozempic as prescribed. ? ?Please continue to take other medications as prescribed. ? ?Please continue to follow low carb diet and perform moderate exercise/walking at least 150 mins/week. ? ?Okay to use Debrox ear drops for ear wax. Please avoid using any sharp objects for cleaning purposes. ? ?Please get fasting blood tests done before the next visit. ? ?You are being referred to Neurology. ?

## 2022-03-21 NOTE — Assessment & Plan Note (Signed)
HbA1C: 6.5 ?On Metformin ?Had diarrhea with Mounjaro ?Started Ozempic instead as he had tolerated it well in the past ?Advised to follow diabetic diet ?On statin and ACEi ?F/u CMP and lipid panel ?Diabetic eye exam: Advised to follow up with Ophthalmology for diabetic eye exam ? ?

## 2022-03-21 NOTE — Assessment & Plan Note (Signed)
On Pravastatin Check lipid profile 

## 2022-03-21 NOTE — Assessment & Plan Note (Signed)
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

## 2022-03-21 NOTE — Assessment & Plan Note (Signed)
BP Readings from Last 1 Encounters:  03/21/22 118/72   Well-controlled with Amlodipine and Benazepril Counseled for compliance with the medications Advised DASH diet and moderate exercise/walking, at least 150 mins/week

## 2022-03-22 ENCOUNTER — Encounter: Payer: Self-pay | Admitting: Neurology

## 2022-03-26 ENCOUNTER — Other Ambulatory Visit (HOSPITAL_COMMUNITY): Payer: Self-pay

## 2022-03-26 ENCOUNTER — Other Ambulatory Visit: Payer: Self-pay | Admitting: Internal Medicine

## 2022-03-26 DIAGNOSIS — E1169 Type 2 diabetes mellitus with other specified complication: Secondary | ICD-10-CM

## 2022-03-26 MED ORDER — METFORMIN HCL 500 MG PO TABS
500.0000 mg | ORAL_TABLET | Freq: Every day | ORAL | 1 refills | Status: DC
  Filled 2022-03-26 – 2022-04-24 (×2): qty 90, 90d supply, fill #0
  Filled 2022-08-04: qty 90, 90d supply, fill #1

## 2022-03-27 NOTE — Progress Notes (Deleted)
? ?Assessment/Plan:  ? ?***1.  Episodes of hemibody jerking ? -The sound less like myoclonus (that would not be just hemibody) and more concerning for possible focal seizure ? -We will do MRI brain with and without gadolinium. ? -We will do EEG.  If that is negative, we will do ambulatory EEG. ? -Could be helpful if patient is able to get [place] a video of the episode. ? ?2.  Obstructive sleep apnea syndrome, severe ? -Had sleep study done in 2020 with [facility] 93.  BiPAP recommended. ?Subjective:  ? ?James Murphy was seen today in neurologic consultation at the request of Lindell Spar, MD. patient was sent for the evaluation of myoclonus.  Medical records made available to me are reviewed.  Patient is a 56 year old male with a history of hypertension, hyperlipidemia, obesity, sleep apnea (had study done by Dr. Merlene Laughter in 2020 with AHI 93!), diabetes who complained to primary care physician about intermittent jerking movements of the left arm and left leg.  Symptoms started several months ago.  Patient states that when it happens, it lasts approximately ***.  Patient states that frequency of episodes is about ***.  Patient also reports that he has times when his left upper extremity does not want to work and he has to use his right hand in order to move the left hand.  During an episode, he has no speech problems.  He has no alteration or loss of consciousness.  He has no ambulation problems.  He has no sensory disturbance. ? ?Patient has never had known neuroimaging of the brain that is available to me. ? ?PREVIOUS MEDICATIONS: *** ? ?ALLERGIES:   ?Allergies  ?Allergen Reactions  ? Molds & Smuts   ? ? ?CURRENT MEDICATIONS:  ?Outpatient Encounter Medications as of 04/01/2022  ?Medication Sig  ? amLODipine (NORVASC) 10 MG tablet Take 1 tablet (10 mg total) by mouth daily.  ? aspirin 81 MG tablet Take 81 mg by mouth daily.  ? benazepril (LOTENSIN) 40 MG tablet Take 1 tablet (40 mg total) by mouth daily.  ? Blood Glucose  Monitoring Suppl (BLOOD GLUCOSE SYSTEM PAK) KIT Please dispense based on patient and insurance preference. Use as directed to monitor FSBS 3x weekly. Dx: E11.9.  ? cetirizine (ZYRTEC) 10 MG tablet Take 1 tablet (10 mg total) by mouth daily.  ? fluticasone (FLONASE) 50 MCG/ACT nasal spray Place 2 sprays into both nostrils daily.  ? furosemide (LASIX) 20 MG tablet Take 1 tablet (20 mg total) by mouth daily.  ? Glucose Blood (BLOOD GLUCOSE TEST STRIPS) STRP Please dispense based on patient and insurance preference. Use as directed to monitor FSBS 3x weekly. Dx: E11.9.  ? Lancets MISC Please dispense based on patient and insurance preference. Use as directed to monitor FSBS 3x weekly. Dx: E11.9.  ? metFORMIN (GLUCOPHAGE) 500 MG tablet Take 1 tablet (500 mg total) by mouth daily with breakfast.  ? pravastatin (PRAVACHOL) 40 MG tablet Take 1 tablet (40 mg total) by mouth daily.  ? Semaglutide, 1 MG/DOSE, 4 MG/3ML SOPN Inject 1 mg as directed once a week.  ? sodium chloride (OCEAN) 0.65 % nasal spray Place 1 spray into both nostrils as needed for congestion  ? Vitamin D, Ergocalciferol, (DRISDOL) 1.25 MG (50000 UNIT) CAPS capsule Take 1 capsule (50,000 Units total) by mouth every 7 (seven) days.  ? ?No facility-administered encounter medications on file as of 04/01/2022.  ? ? ?Objective:  ? ?PHYSICAL EXAMINATION:   ? ?VITALS:  There were no vitals  filed for this visit. ? ?GEN:  Normal appears male in no acute distress.  Appears stated age. ?HEENT:  Normocephalic, atraumatic. The mucous membranes are moist. The superficial temporal arteries are without ropiness or tenderness. ?Cardiovascular: Regular rate and rhythm. ?Lungs: Clear to auscultation bilaterally. ?Neck/Heme: There are no carotid bruits noted bilaterally. ? ?NEUROLOGICAL: ?Orientation:  The patient is alert and oriented x 3.   ?Cranial nerves: There is good facial symmetry.  Extraocular muscles are intact and visual fields are full to confrontational testing.  Speech is fluent and clear. Soft palate rises symmetrically and there is no tongue deviation. Hearing is intact to conversational tone. ?Tone: Tone is good throughout. ?Sensation: Sensation is intact to light touch and pinprick throughout (facial, trunk, extremities). Vibration is intact at the bilateral big toe. There is no extinction with double simultaneous stimulation. There is no sensory dermatomal level identified. ?Coordination:  The patient has no difficulty with RAM's or FNF bilaterally. ?Motor: Strength is 5/5 in the bilateral upper and lower extremities.  Shoulder shrug is equal and symmetric. There is no pronator drift.  There are no fasciculations noted. ?DTR's: Deep tendon reflexes are 2/4 at the bilateral biceps, triceps, brachioradialis, patella and achilles.  Plantar responses are downgoing bilaterally. ?Gait and Station: The patient is able to ambulate without difficulty. The patient is able to heel toe walk without any difficulty. The patient is able to ambulate in a tandem fashion. The patient is able to stand in the Romberg position. ? ? ? ?Total time spent on today's visit was ***greater than 60 minutes, including both face-to-face time and nonface-to-face time.  Time included that spent on review of records (prior notes available to me/labs/imaging if pertinent), discussing treatment and goals, answering patient's questions and coordinating care. ? ? ?Cc:  Lindell Spar, MD ? ?

## 2022-04-01 ENCOUNTER — Encounter: Payer: Self-pay | Admitting: Neurology

## 2022-04-01 ENCOUNTER — Ambulatory Visit: Payer: 59 | Admitting: Neurology

## 2022-04-01 DIAGNOSIS — Z029 Encounter for administrative examinations, unspecified: Secondary | ICD-10-CM

## 2022-04-03 ENCOUNTER — Other Ambulatory Visit (HOSPITAL_COMMUNITY): Payer: Self-pay

## 2022-04-04 ENCOUNTER — Encounter (HOSPITAL_COMMUNITY): Payer: Self-pay | Admitting: Emergency Medicine

## 2022-04-04 ENCOUNTER — Other Ambulatory Visit (HOSPITAL_COMMUNITY): Payer: Self-pay

## 2022-04-04 ENCOUNTER — Ambulatory Visit (HOSPITAL_COMMUNITY)
Admission: EM | Admit: 2022-04-04 | Discharge: 2022-04-04 | Disposition: A | Discharge status: Home / Self Care | Payer: 59 | Attending: Physician Assistant | Admitting: Physician Assistant

## 2022-04-04 ENCOUNTER — Other Ambulatory Visit: Payer: Self-pay

## 2022-04-04 DIAGNOSIS — R112 Nausea with vomiting, unspecified: Secondary | ICD-10-CM | POA: Diagnosis not present

## 2022-04-04 DIAGNOSIS — R197 Diarrhea, unspecified: Secondary | ICD-10-CM | POA: Insufficient documentation

## 2022-04-04 LAB — POCT URINALYSIS DIPSTICK, ED / UC
Glucose, UA: NEGATIVE mg/dL
Hgb urine dipstick: NEGATIVE
Leukocytes,Ua: NEGATIVE
Nitrite: NEGATIVE
Protein, ur: NEGATIVE mg/dL
Specific Gravity, Urine: >=1.03 (ref 1.005–1.030)
Urobilinogen, UA: 0.2 mg/dL (ref 0.0–1.0)
pH: 5.5 (ref 5.0–8.0)

## 2022-04-04 LAB — COMPREHENSIVE METABOLIC PANEL
ALT: 18 U/L (ref 0–44)
AST: 19 U/L (ref 15–41)
Albumin: 4.1 g/dL (ref 3.5–5.0)
Alkaline Phosphatase: 51 U/L (ref 38–126)
Anion gap: 8 (ref 5–15)
BUN: 16 mg/dL (ref 6–20)
CO2: 27 mmol/L (ref 22–32)
Calcium: 9.2 mg/dL (ref 8.9–10.3)
Chloride: 104 mmol/L (ref 98–111)
Creatinine, Ser: 1.26 mg/dL — ABNORMAL HIGH (ref 0.61–1.24)
GFR, Estimated: >60 mL/min (ref >60)
Glucose, Bld: 89 mg/dL (ref 70–99)
Potassium: 4.4 mmol/L (ref 3.5–5.1)
Sodium: 139 mmol/L (ref 135–145)
Total Bilirubin: 0.5 mg/dL (ref 0.3–1.2)
Total Protein: 7.8 g/dL (ref 6.5–8.1)

## 2022-04-04 LAB — CBC WITH DIFFERENTIAL/PLATELET
Abs Immature Granulocytes: 0.02 10*3/uL (ref 0.00–0.07)
Basophils Absolute: 0 10*3/uL (ref 0.0–0.1)
Basophils Relative: 0 %
Eosinophils Absolute: 0.3 10*3/uL (ref 0.0–0.5)
Eosinophils Relative: 4 %
HCT: 45 % (ref 39.0–52.0)
Hemoglobin: 14.6 g/dL (ref 13.0–17.0)
Immature Granulocytes: 0 %
Lymphocytes Relative: 35 %
Lymphs Abs: 2.5 10*3/uL (ref 0.7–4.0)
MCH: 28.9 pg (ref 26.0–34.0)
MCHC: 32.4 g/dL (ref 30.0–36.0)
MCV: 89.1 fL (ref 80.0–100.0)
Monocytes Absolute: 0.8 10*3/uL (ref 0.1–1.0)
Monocytes Relative: 11 %
Neutro Abs: 3.5 10*3/uL (ref 1.7–7.7)
Neutrophils Relative %: 50 %
Platelets: 351 10*3/uL (ref 150–400)
RBC: 5.05 MIL/uL (ref 4.22–5.81)
RDW: 14.1 % (ref 11.5–15.5)
WBC: 7 10*3/uL (ref 4.0–10.5)
nRBC: 0 % (ref 0.0–0.2)

## 2022-04-04 LAB — CBG MONITORING, ED: Glucose-Capillary: 99 mg/dL (ref 70–99)

## 2022-04-04 MED ORDER — ONDANSETRON 4 MG PO TBDP
4.0000 mg | ORAL_TABLET | Freq: Once | ORAL | Status: AC
  Administered 2022-04-04: 4 mg via ORAL

## 2022-04-04 MED ORDER — ONDANSETRON 4 MG PO TBDP
ORAL_TABLET | ORAL | Status: AC
  Filled 2022-04-04: qty 1

## 2022-04-04 MED ORDER — ONDANSETRON 4 MG PO TBDP
4.0000 mg | ORAL_TABLET | Freq: Three times a day (TID) | ORAL | 0 refills | Status: AC | PRN
  Filled 2022-04-04: qty 20, 7d supply, fill #0
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
## 2022-04-04 NOTE — Discharge Instructions (Signed)
I am glad that you are able to eat and drink with the medication today.  I would like you to use that medicine on an every 8 hour schedule for the next few days.  You need to drink fluids regularly and eat a bland diet (i.e. brat diet which stands for bananas, rice, applesauce, toast).  Eat small frequent meals.  If at any point anything worsens and you have recurrent nausea/vomiting interfere with oral intake, abdominal pain, fever, blood in your vomit, blood in your stool, dark stools you need to go to the emergency room immediately.  Follow-up with your primary care provider next week. ?

## 2022-04-04 NOTE — ED Triage Notes (Signed)
Vomiting and diarrhea for 3 days.  No one else sick in the home.  Reports abdominal pain and leg cramps.  Reports belching frequently and smells like "rotten eggs".  2 episodes of vomiting today.  1 episode of diarrhea while vomiting this morning ?

## 2022-04-05 ENCOUNTER — Other Ambulatory Visit (HOSPITAL_COMMUNITY): Payer: Self-pay

## 2022-04-05 ENCOUNTER — Ambulatory Visit: Payer: 59 | Admitting: Internal Medicine

## 2022-04-05 ENCOUNTER — Encounter: Payer: Self-pay | Admitting: Internal Medicine

## 2022-04-05 VITALS — BP 116/74 | HR 88 | Ht 69.0 in | Wt 281.2 lb

## 2022-04-05 DIAGNOSIS — R110 Nausea: Secondary | ICD-10-CM | POA: Diagnosis not present

## 2022-04-05 DIAGNOSIS — R252 Cramp and spasm: Secondary | ICD-10-CM

## 2022-04-05 DIAGNOSIS — E1169 Type 2 diabetes mellitus with other specified complication: Secondary | ICD-10-CM | POA: Diagnosis not present

## 2022-04-05 MED ORDER — TIZANIDINE HCL 4 MG PO TABS
4.0000 mg | ORAL_TABLET | Freq: Four times a day (QID) | ORAL | 0 refills | Status: DC | PRN
  Filled 2022-04-05: qty 30, 8d supply, fill #0

## 2022-04-05 NOTE — Assessment & Plan Note (Addendum)
HbA1C: 6.5 ?On Metformin ?Had diarrhea with Mounjaro and Ozempic ?Discontinue Ozempic now as he has severe diarrhea and nausea/vomiting with it ?Advised to follow diabetic diet ?On statin and ACEi ?F/u CMP and lipid panel ?Diabetic eye exam: Advised to follow up with Ophthalmology for diabetic eye exam ?
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
## 2022-04-05 NOTE — Patient Instructions (Addendum)
Please stop taking Ozempic. ? ?Please continue taking Zofran as needed for nausea/vomiting. ? ?Please continue to stay well-hydrated and eat as tolerated. ?

## 2022-04-06 ENCOUNTER — Emergency Department (HOSPITAL_COMMUNITY): Payer: 59

## 2022-04-06 ENCOUNTER — Emergency Department (HOSPITAL_COMMUNITY)
Admission: EM | Admit: 2022-04-06 | Discharge: 2022-04-06 | Disposition: A | Discharge status: Home / Self Care | Payer: 59 | Attending: Emergency Medicine | Admitting: Emergency Medicine

## 2022-04-06 ENCOUNTER — Other Ambulatory Visit: Payer: Self-pay

## 2022-04-06 DIAGNOSIS — R1110 Vomiting, unspecified: Secondary | ICD-10-CM | POA: Diagnosis not present

## 2022-04-06 DIAGNOSIS — Z7984 Long term (current) use of oral hypoglycemic drugs: Secondary | ICD-10-CM | POA: Insufficient documentation

## 2022-04-06 DIAGNOSIS — E860 Dehydration: Secondary | ICD-10-CM | POA: Diagnosis not present

## 2022-04-06 DIAGNOSIS — R1084 Generalized abdominal pain: Secondary | ICD-10-CM | POA: Diagnosis not present

## 2022-04-06 DIAGNOSIS — I700 Atherosclerosis of aorta: Secondary | ICD-10-CM | POA: Diagnosis not present

## 2022-04-06 DIAGNOSIS — E119 Type 2 diabetes mellitus without complications: Secondary | ICD-10-CM | POA: Diagnosis not present

## 2022-04-06 DIAGNOSIS — Z79899 Other long term (current) drug therapy: Secondary | ICD-10-CM | POA: Diagnosis not present

## 2022-04-06 DIAGNOSIS — I10 Essential (primary) hypertension: Secondary | ICD-10-CM | POA: Diagnosis not present

## 2022-04-06 DIAGNOSIS — R112 Nausea with vomiting, unspecified: Secondary | ICD-10-CM | POA: Diagnosis not present

## 2022-04-06 DIAGNOSIS — Z7982 Long term (current) use of aspirin: Secondary | ICD-10-CM | POA: Insufficient documentation

## 2022-04-06 DIAGNOSIS — R197 Diarrhea, unspecified: Secondary | ICD-10-CM | POA: Diagnosis not present

## 2022-04-06 DIAGNOSIS — R109 Unspecified abdominal pain: Secondary | ICD-10-CM | POA: Diagnosis not present

## 2022-04-06 DIAGNOSIS — R000 Tachycardia, unspecified: Secondary | ICD-10-CM | POA: Diagnosis not present

## 2022-04-06 LAB — COMPREHENSIVE METABOLIC PANEL
ALT: 16 U/L (ref 0–44)
AST: 13 U/L — ABNORMAL LOW (ref 15–41)
Albumin: 4.1 g/dL (ref 3.5–5.0)
Alkaline Phosphatase: 54 U/L (ref 38–126)
Anion gap: 8 (ref 5–15)
BUN: 30 mg/dL — ABNORMAL HIGH (ref 6–20)
CO2: 23 mmol/L (ref 22–32)
Calcium: 8.6 mg/dL — ABNORMAL LOW (ref 8.9–10.3)
Chloride: 107 mmol/L (ref 98–111)
Creatinine, Ser: 1.5 mg/dL — ABNORMAL HIGH (ref 0.61–1.24)
GFR, Estimated: 55 mL/min — ABNORMAL LOW (ref >60)
Glucose, Bld: 104 mg/dL — ABNORMAL HIGH (ref 70–99)
Potassium: 3.6 mmol/L (ref 3.5–5.1)
Sodium: 138 mmol/L (ref 135–145)
Total Bilirubin: 0.7 mg/dL (ref 0.3–1.2)
Total Protein: 8.1 g/dL (ref 6.5–8.1)

## 2022-04-06 LAB — URINALYSIS, ROUTINE W REFLEX MICROSCOPIC
Bilirubin Urine: NEGATIVE
Glucose, UA: NEGATIVE mg/dL
Hgb urine dipstick: NEGATIVE
Ketones, ur: NEGATIVE mg/dL
Leukocytes,Ua: NEGATIVE
Nitrite: NEGATIVE
Protein, ur: NEGATIVE mg/dL
Specific Gravity, Urine: >1.046 — ABNORMAL HIGH (ref 1.005–1.030)
pH: 5 (ref 5.0–8.0)

## 2022-04-06 LAB — CBC WITH DIFFERENTIAL/PLATELET
Abs Immature Granulocytes: 0.04 10*3/uL (ref 0.00–0.07)
Basophils Absolute: 0 10*3/uL (ref 0.0–0.1)
Basophils Relative: 0 %
Eosinophils Absolute: 0.4 10*3/uL (ref 0.0–0.5)
Eosinophils Relative: 3 %
HCT: 46.5 % (ref 39.0–52.0)
Hemoglobin: 15.2 g/dL (ref 13.0–17.0)
Immature Granulocytes: 0 %
Lymphocytes Relative: 31 %
Lymphs Abs: 3.4 10*3/uL (ref 0.7–4.0)
MCH: 28.8 pg (ref 26.0–34.0)
MCHC: 32.7 g/dL (ref 30.0–36.0)
MCV: 88.1 fL (ref 80.0–100.0)
Monocytes Absolute: 1.1 10*3/uL — ABNORMAL HIGH (ref 0.1–1.0)
Monocytes Relative: 10 %
Neutro Abs: 6.2 10*3/uL (ref 1.7–7.7)
Neutrophils Relative %: 56 %
Platelets: 384 10*3/uL (ref 150–400)
RBC: 5.28 MIL/uL (ref 4.22–5.81)
RDW: 13.8 % (ref 11.5–15.5)
WBC: 11.2 10*3/uL — ABNORMAL HIGH (ref 4.0–10.5)
nRBC: 0 % (ref 0.0–0.2)

## 2022-04-06 LAB — C DIFFICILE QUICK SCREEN W PCR REFLEX
C Diff antigen: NEGATIVE
C Diff interpretation: NOT DETECTED
C Diff toxin: NEGATIVE

## 2022-04-06 LAB — LIPASE, BLOOD: Lipase: 30 U/L (ref 11–51)

## 2022-04-06 MED ORDER — PROCHLORPERAZINE EDISYLATE 10 MG/2ML IJ SOLN
10.0000 mg | Freq: Once | INTRAMUSCULAR | Status: AC
  Administered 2022-04-06: 10 mg via INTRAVENOUS
  Filled 2022-04-06: qty 2

## 2022-04-06 MED ORDER — HYDROCODONE-ACETAMINOPHEN 5-325 MG PO TABS: 1.0000 | ORAL_TABLET | ORAL | 0 refills | Status: DC | PRN

## 2022-04-06 MED ORDER — ONDANSETRON HCL 4 MG/2ML IJ SOLN
4.0000 mg | Freq: Once | INTRAMUSCULAR | Status: AC
  Administered 2022-04-06: 4 mg via INTRAVENOUS
  Filled 2022-04-06: qty 2

## 2022-04-06 MED ORDER — PROMETHAZINE HCL 25 MG PO TABS: 25.0000 mg | ORAL_TABLET | Freq: Four times a day (QID) | ORAL | 0 refills | Status: DC | PRN

## 2022-04-06 MED ORDER — MORPHINE SULFATE (PF) 4 MG/ML IV SOLN
4.0000 mg | Freq: Once | INTRAVENOUS | Status: AC
  Administered 2022-04-06: 4 mg via INTRAVENOUS
  Filled 2022-04-06: qty 1

## 2022-04-06 MED ORDER — IOHEXOL 300 MG/ML  SOLN
100.0000 mL | Freq: Once | INTRAMUSCULAR | Status: AC | PRN
  Administered 2022-04-06: 100 mL via INTRAVENOUS

## 2022-04-06 MED ORDER — METRONIDAZOLE 500 MG PO TABS: 500.0000 mg | ORAL_TABLET | Freq: Two times a day (BID) | ORAL | 0 refills | Status: DC

## 2022-04-06 MED ORDER — SODIUM CHLORIDE 0.9 % IV BOLUS
1000.0000 mL | Freq: Once | INTRAVENOUS | Status: AC
  Administered 2022-04-06: 1000 mL via INTRAVENOUS

## 2022-04-06 MED ORDER — CIPROFLOXACIN HCL 500 MG PO TABS: 500.0000 mg | ORAL_TABLET | Freq: Two times a day (BID) | ORAL | 0 refills | Status: DC

## 2022-04-06 NOTE — ED Triage Notes (Signed)
Pt c/o N/V/D x 5 days. Went to urgent care abd was prescribed zofran, However, pt states it is not helping  ?

## 2022-04-06 NOTE — ED Notes (Signed)
Pt given a urinal and informed of need for urine sample  

## 2022-04-06 NOTE — ED Provider Notes (Signed)
?[facility] ?Provider Note ? ? ?CSN: [id number] ?Arrival date & time: 04/06/22  0818 ? ?  ? ?History ? ?No chief complaint on file. ? ? ?James Murphy is a 56 y.o. male. ? ?Pt is a 56 yo male with a pmhx significant for dm, htn, obesity, and hyperlipidemia.  Pt said he has had 5 days of n/v/d.  He did see UC on 4/13 and was given a rx for Zofran.  That has helped the n/v, but not the diarrhea.  Pt went to his pcp yesterday and was told to stop the Ozempic.  The pt had diarrhea all night long and has abd pain.  He denies any f/c.  No blood in stool.  His wife said his stool smells terrible. ? ? ?  ? ?Home Medications ?Prior to Admission medications   ?Medication Sig Start Date End Date Taking? Authorizing Provider  ?amLODipine (NORVASC) 10 MG tablet Take 1 tablet (10 mg total) by mouth daily. 05/09/21  Yes Lindell Spar, MD  ?aspirin 81 MG tablet Take 81 mg by mouth daily.   Yes [provider]  ?benazepril (LOTENSIN) 40 MG tablet Take 1 tablet (40 mg total) by mouth daily. 05/09/21  Yes Lindell Spar, MD  ?bismuth subsalicylate (PEPTO BISMOL) 262 MG/15ML suspension Take 30 mLs by mouth every 6 (six) hours as needed for diarrhea or loose stools.   Yes [provider]  ?cetirizine (ZYRTEC) 10 MG tablet Take 1 tablet (10 mg total) by mouth daily. 02/08/22  Yes Paseda, Dewaine Conger, FNP  ?ciprofloxacin (CIPRO) 500 MG tablet Take 1 tablet (500 mg total) by mouth 2 (two) times daily. 04/06/22  Yes Isla Pence, MD  ?fluticasone (FLONASE) 50 MCG/ACT nasal spray Place 2 sprays into both nostrils daily. 02/08/22  Yes Paseda, Dewaine Conger, FNP  ?furosemide (LASIX) 20 MG tablet Take 1 tablet (20 mg total) by mouth daily. 11/07/21  Yes Lindell Spar, MD  ?HYDROcodone-acetaminophen (NORCO/VICODIN) 5-325 MG tablet Take 1 tablet by mouth every 4 (four) hours as needed. 04/06/22  Yes Isla Pence, MD  ?metFORMIN (GLUCOPHAGE) 500 MG tablet Take 1 tablet (500 mg total) by mouth daily with  breakfast. 03/26/22  Yes Lindell Spar, MD  ?metroNIDAZOLE (FLAGYL) 500 MG tablet Take 1 tablet (500 mg total) by mouth 2 (two) times daily. 04/06/22  Yes Isla Pence, MD  ?ondansetron (ZOFRAN-ODT) 4 MG disintegrating tablet Take 1 tablet (4 mg total) by mouth every 8 (eight) hours as needed for nausea or vomiting. 04/04/22  Yes Raspet, Erin K, PA-C  ?pravastatin (PRAVACHOL) 40 MG tablet Take 1 tablet (40 mg total) by mouth daily. 05/09/21  Yes Lindell Spar, MD  ?promethazine (PHENERGAN) 25 MG tablet Take 1 tablet (25 mg total) by mouth every 6 (six) hours as needed for nausea or vomiting. 04/06/22  Yes Isla Pence, MD  ?sodium chloride (OCEAN) 0.65 % nasal spray Place 1 spray into both nostrils as needed for congestion 02/08/22  Yes Paseda, Dewaine Conger, FNP  ?tiZANidine (ZANAFLEX) 4 MG tablet Take 1 tablet (4 mg total) by mouth every 6 (six) hours as needed for muscle spasms. 04/05/22  Yes Lindell Spar, MD  ?Vitamin D, Ergocalciferol, (DRISDOL) 1.25 MG (50000 UNIT) CAPS capsule Take 1 capsule (50,000 Units total) by mouth every 7 (seven) days. 11/10/20  Yes Alto, Modena Nunnery, MD  ?Blood Glucose Monitoring Suppl (BLOOD GLUCOSE SYSTEM PAK) KIT Please dispense based on patient and insurance preference. Use as directed to monitor FSBS 3x  weekly. Dx: E11.9. 01/07/19   Alycia Rossetti, MD  ?Glucose Blood (BLOOD GLUCOSE TEST STRIPS) STRP Please dispense based on patient and insurance preference. Use as directed to monitor FSBS 3x weekly. Dx: E11.9. 01/07/19   Alycia Rossetti, MD  ?Lancets MISC Please dispense based on patient and insurance preference. Use as directed to monitor FSBS 3x weekly. Dx: E11.9. 01/07/19   Alycia Rossetti, MD  ?   ? ?Allergies    ?Molds & smuts and Ozempic (0.25 or 0.5 mg-dose) [semaglutide(0.25 or 0.54m-dos)]   ? ?Review of Systems   ?Review of Systems  ?Gastrointestinal:  Positive for abdominal pain, diarrhea, nausea and vomiting.  ?All other systems reviewed and are  negative. ? ?Physical Exam ?Updated Vital Signs ?BP 131/84   Pulse 92   Temp 98.5 ?F (36.9 ?C) (Oral)   Resp (!) 21   Ht 5' 9"  (1.753 m)   Wt 127 kg   SpO2 98%   BMI 41.35 kg/m?  ?Physical Exam ?Vitals and nursing note reviewed.  ?Constitutional:   ?   Appearance: Normal appearance. He is obese.  ?HENT:  ?   Head: Normocephalic and atraumatic.  ?   Right Ear: External ear normal.  ?   Left Ear: External ear normal.  ?   Nose: Nose normal.  ?   Mouth/Throat:  ?   Mouth: Mucous membranes are dry.  ?Eyes:  ?   Extraocular Movements: Extraocular movements intact.  ?   Conjunctiva/sclera: Conjunctivae normal.  ?   Pupils: Pupils are equal, round, and reactive to light.  ?Cardiovascular:  ?   Rate and Rhythm: Regular rhythm. Tachycardia present.  ?   Pulses: Normal pulses.  ?   Heart sounds: Normal heart sounds.  ?Pulmonary:  ?   Effort: Pulmonary effort is normal.  ?   Breath sounds: Normal breath sounds.  ?Abdominal:  ?   General: Abdomen is flat. Bowel sounds are normal.  ?   Palpations: Abdomen is soft.  ?   Tenderness: There is generalized abdominal tenderness.  ?Musculoskeletal:     ?   General: Normal range of motion.  ?   Cervical back: Normal range of motion and neck supple.  ?Skin: ?   General: Skin is warm.  ?   Capillary Refill: Capillary refill takes less than 2 seconds.  ?Neurological:  ?   General: No focal deficit present.  ?   Mental Status: He is alert and oriented to person, place, and time.  ?Psychiatric:     ?   Mood and Affect: Mood normal.     ?   Behavior: Behavior normal.  ? ? ?ED Results / Procedures / Treatments   ?Labs ?(all labs ordered are listed, but only abnormal results are displayed) ?Labs Reviewed  ?CBC WITH DIFFERENTIAL/PLATELET - Abnormal; Notable for the following components:  ?    Result Value  ? WBC 11.2 (*)   ? Monocytes Absolute 1.1 (*)   ? All other components within normal limits  ?COMPREHENSIVE METABOLIC PANEL - Abnormal; Notable for the following components:  ? Glucose,  Bld 104 (*)   ? BUN 30 (*)   ? Creatinine, Ser 1.50 (*)   ? Calcium 8.6 (*)   ? AST 13 (*)   ? GFR, Estimated 55 (*)   ? All other components within normal limits  ?URINALYSIS, ROUTINE W REFLEX MICROSCOPIC - Abnormal; Notable for the following components:  ? Specific Gravity, Urine >1.046 (*)   ? All other components  within normal limits  ?C DIFFICILE QUICK SCREEN W PCR REFLEX    ?GASTROINTESTINAL PANEL BY PCR, STOOL (REPLACES STOOL CULTURE)  ?LIPASE, BLOOD  ? ? ?EKG ?EKG Interpretation ? ?Date/Time:  Saturday April 06 2022 08:35:44 EDT ?Ventricular Rate:  100 ?PR Interval:  147 ?QRS Duration: 87 ?QT Interval:  292 ?QTC Calculation: 377 ?R Axis:   69 ?Text Interpretation: Sinus tachycardia Ventricular premature complex Borderline repolarization abnormality Baseline wander in lead(s) V3 No old tracing to compare Confirmed by Isla Pence [phone]) on 04/06/2022 9:31:55 AM ? ?Radiology ?CT ABDOMEN PELVIS W CONTRAST ? ?Result Date: 04/06/2022 ?CLINICAL DATA:  Acute nonlocalized abdomen pain with nausea and vomiting for 5 days. EXAM: CT ABDOMEN AND PELVIS WITH CONTRAST TECHNIQUE: Multidetector CT imaging of the abdomen and pelvis was performed using the standard protocol following bolus administration of intravenous contrast. RADIATION DOSE REDUCTION: This exam was performed according to the departmental dose-optimization program which includes automated exposure control, adjustment of the mA and/or kV according to patient size and/or use of iterative reconstruction technique. CONTRAST:  176m OMNIPAQUE IOHEXOL 300 MG/ML  SOLN COMPARISON:  None. FINDINGS: Lower chest: Minimal dependent atelectasis of the posterior lung bases are identified. Heart size is normal. Hepatobiliary: Diffuse low density of the liver is identified without focal vessel displacement. No focal liver mass is noted. The gallbladder and biliary tree are normal. Pancreas: Unremarkable. No pancreatic ductal dilatation or surrounding inflammatory  changes. Spleen: Normal in size without focal abnormality. Adrenals/Urinary Tract: Bilateral adrenal glands are normal. Simple cysts are identified in bilateral kidneys. There is no hydronephrosis bilaterally. Bladder is normal. [place]

## 2022-04-06 NOTE — ED Notes (Signed)
Pt tolerated fluids po well  ° °

## 2022-04-07 LAB — GASTROINTESTINAL PANEL BY PCR, STOOL (REPLACES STOOL CULTURE)

## 2022-04-15 ENCOUNTER — Other Ambulatory Visit (HOSPITAL_COMMUNITY): Payer: Self-pay

## 2022-04-24 ENCOUNTER — Other Ambulatory Visit (HOSPITAL_COMMUNITY): Payer: Self-pay

## 2022-06-14 ENCOUNTER — Other Ambulatory Visit (HOSPITAL_COMMUNITY): Payer: Self-pay

## 2022-06-14 ENCOUNTER — Other Ambulatory Visit: Payer: Self-pay | Admitting: Internal Medicine

## 2022-06-14 DIAGNOSIS — E1169 Type 2 diabetes mellitus with other specified complication: Secondary | ICD-10-CM

## 2022-06-14 DIAGNOSIS — I10 Essential (primary) hypertension: Secondary | ICD-10-CM

## 2022-06-14 MED ORDER — BENAZEPRIL HCL 40 MG PO TABS
40.0000 mg | ORAL_TABLET | Freq: Every day | ORAL | 3 refills | Status: DC
  Filled 2022-06-14: qty 90, 90d supply, fill #0
  Filled 2022-10-03: qty 90, 90d supply, fill #1
  Filled 2022-12-25 – 2022-12-26 (×2): qty 90, 90d supply, fill #2
  Filled 2023-03-31: qty 90, 90d supply, fill #3

## 2022-06-14 MED ORDER — PRAVASTATIN SODIUM 40 MG PO TABS
40.0000 mg | ORAL_TABLET | Freq: Every day | ORAL | 2 refills | Status: DC
  Filled 2022-06-14: qty 90, 90d supply, fill #0
  Filled 2022-10-03: qty 90, 90d supply, fill #1
  Filled 2022-12-25 – 2022-12-26 (×2): qty 90, 90d supply, fill #2

## 2022-06-14 MED ORDER — AMLODIPINE BESYLATE 10 MG PO TABS
10.0000 mg | ORAL_TABLET | Freq: Every day | ORAL | 3 refills | Status: DC
  Filled 2022-06-14: qty 90, 90d supply, fill #0
  Filled 2022-10-03: qty 90, 90d supply, fill #1
  Filled 2022-12-25 – 2022-12-26 (×2): qty 90, 90d supply, fill #2
  Filled 2023-03-31: qty 90, 90d supply, fill #3

## 2022-07-25 ENCOUNTER — Encounter: Payer: Self-pay | Admitting: Internal Medicine

## 2022-07-25 ENCOUNTER — Ambulatory Visit: Payer: 59 | Admitting: Internal Medicine

## 2022-07-25 VITALS — BP 144/88 | HR 70 | Resp 18 | Ht 69.0 in | Wt 287.0 lb

## 2022-07-25 DIAGNOSIS — Z1211 Encounter for screening for malignant neoplasm of colon: Secondary | ICD-10-CM | POA: Diagnosis not present

## 2022-07-25 DIAGNOSIS — E1169 Type 2 diabetes mellitus with other specified complication: Secondary | ICD-10-CM

## 2022-07-25 DIAGNOSIS — G253 Myoclonus: Secondary | ICD-10-CM

## 2022-07-25 DIAGNOSIS — I10 Essential (primary) hypertension: Secondary | ICD-10-CM | POA: Diagnosis not present

## 2022-07-25 LAB — POCT GLYCOSYLATED HEMOGLOBIN (HGB A1C)
HbA1c POC (<> result, manual entry): 6 % (ref 4.0–5.6)
HbA1c, POC (controlled diabetic range): 6 % (ref 0.0–7.0)
HbA1c, POC (prediabetic range): 6 % (ref 5.7–6.4)

## 2022-07-25 NOTE — Assessment & Plan Note (Signed)
Ordered PSA after discussing its limitations for prostate cancer screening, including false positive results leading additional investigations. 

## 2022-07-25 NOTE — Assessment & Plan Note (Signed)
BP Readings from Last 1 Encounters:  07/25/22 (!) 144/88   Elevated today since he just took his today's dose Usually well-controlled with Amlodipine and Benazepril Counseled for compliance with the medications Advised DASH diet and moderate exercise/walking, at least 150 mins/week

## 2022-07-25 NOTE — Patient Instructions (Signed)
Please take Colace for constipation.  Please continue taking other medications as prescribed.  Please continue to follow low salt diet and perform moderate exercise/walking at least 150 mins/week.
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
## 2022-07-25 NOTE — Assessment & Plan Note (Signed)
Lab Results  Component Value Date   HGBA1C 6.0 07/25/2022   HGBA1C 6.0 07/25/2022   HGBA1C 6.0 07/25/2022   Well-controlled On Metformin Had diarrhea with Mounjaro and Ozempic Advised to follow diabetic diet On statin and ACEi F/u CMP and lipid panel Diabetic eye exam: Advised to follow up with Ophthalmology for diabetic eye exam

## 2022-07-25 NOTE — Assessment & Plan Note (Signed)
His episodes of jerking movements were thought to be due to myoclonic jerks Had referred to neurology - but he did not f/u as it resolved spontaneously

## 2022-07-27 LAB — MICROALBUMIN / CREATININE URINE RATIO
Creatinine, Urine: 105.8 mg/dL
Microalb/Creat Ratio: 3 mg/g creat (ref 0–29)
Microalbumin, Urine: 3 ug/mL

## 2022-08-05 ENCOUNTER — Other Ambulatory Visit (HOSPITAL_COMMUNITY): Payer: Self-pay

## 2022-08-14 DIAGNOSIS — Z1211 Encounter for screening for malignant neoplasm of colon: Secondary | ICD-10-CM | POA: Diagnosis not present

## 2022-08-21 ENCOUNTER — Ambulatory Visit: Payer: 59 | Admitting: Internal Medicine

## 2022-08-21 ENCOUNTER — Other Ambulatory Visit (HOSPITAL_COMMUNITY): Payer: Self-pay

## 2022-08-21 ENCOUNTER — Encounter: Payer: Self-pay | Admitting: Internal Medicine

## 2022-08-21 VITALS — BP 142/86 | HR 62 | Resp 18 | Ht 69.0 in | Wt 289.0 lb

## 2022-08-21 DIAGNOSIS — Z23 Encounter for immunization: Secondary | ICD-10-CM

## 2022-08-21 DIAGNOSIS — I10 Essential (primary) hypertension: Secondary | ICD-10-CM

## 2022-08-21 MED ORDER — HYDRALAZINE HCL 10 MG PO TABS
10.0000 mg | ORAL_TABLET | Freq: Two times a day (BID) | ORAL | 3 refills | Status: DC
  Filled 2022-08-21 – 2022-09-02 (×2): qty 60, 30d supply, fill #0
  Filled 2022-10-03: qty 60, 30d supply, fill #1
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
## 2022-08-21 NOTE — Progress Notes (Signed)
140/88

## 2022-08-21 NOTE — Assessment & Plan Note (Signed)
BP Readings from Last 1 Encounters:  08/21/22 (!) 142/86   Elevated today Uncontrolled with Amlodipine and Benazepril Takes Lasix 20 mg QOD for leg swelling Added Hydralazine 10 mg BID Counseled for compliance with the medications Advised DASH diet and moderate exercise/walking, at least 150 mins/week

## 2022-08-21 NOTE — Patient Instructions (Signed)
Please take Hydralazine 10 mg twice daily.  Please continue taking other medications as prescribed.  Please continue to follow DASH diet and perform moderate exercise/walking at least 150 mins/week.

## 2022-08-23 LAB — COLOGUARD: COLOGUARD: NEGATIVE

## 2022-08-29 NOTE — Progress Notes (Signed)
NA / NVM 

## 2022-09-02 ENCOUNTER — Other Ambulatory Visit (HOSPITAL_COMMUNITY): Payer: Self-pay

## 2022-10-03 ENCOUNTER — Other Ambulatory Visit (HOSPITAL_COMMUNITY): Payer: Self-pay

## 2022-10-04 ENCOUNTER — Other Ambulatory Visit (HOSPITAL_COMMUNITY): Payer: Self-pay

## 2022-11-05 ENCOUNTER — Other Ambulatory Visit: Payer: Self-pay | Admitting: Internal Medicine

## 2022-11-05 ENCOUNTER — Other Ambulatory Visit (HOSPITAL_COMMUNITY): Payer: Self-pay

## 2022-11-05 DIAGNOSIS — E1169 Type 2 diabetes mellitus with other specified complication: Secondary | ICD-10-CM

## 2022-11-05 MED ORDER — METFORMIN HCL 500 MG PO TABS
500.0000 mg | ORAL_TABLET | Freq: Every day | ORAL | 1 refills | Status: DC
  Filled 2022-11-05: qty 90, 90d supply, fill #0
  Filled 2023-02-05: qty 90, 90d supply, fill #1

## 2022-12-25 ENCOUNTER — Ambulatory Visit (INDEPENDENT_AMBULATORY_CARE_PROVIDER_SITE_OTHER): Payer: Commercial Managed Care - PPO | Admitting: Internal Medicine

## 2022-12-25 ENCOUNTER — Encounter: Payer: Self-pay | Admitting: Internal Medicine

## 2022-12-25 ENCOUNTER — Other Ambulatory Visit (HOSPITAL_COMMUNITY): Payer: Self-pay

## 2022-12-25 VITALS — BP 134/87 | HR 85 | Ht 69.0 in | Wt 292.6 lb

## 2022-12-25 DIAGNOSIS — I10 Essential (primary) hypertension: Secondary | ICD-10-CM

## 2022-12-25 DIAGNOSIS — G4733 Obstructive sleep apnea (adult) (pediatric): Secondary | ICD-10-CM

## 2022-12-25 DIAGNOSIS — E1169 Type 2 diabetes mellitus with other specified complication: Secondary | ICD-10-CM

## 2022-12-25 DIAGNOSIS — M7989 Other specified soft tissue disorders: Secondary | ICD-10-CM | POA: Diagnosis not present

## 2022-12-25 DIAGNOSIS — E782 Mixed hyperlipidemia: Secondary | ICD-10-CM | POA: Diagnosis not present

## 2022-12-25 MED ORDER — HYDRALAZINE HCL 10 MG PO TABS
10.0000 mg | ORAL_TABLET | Freq: Two times a day (BID) | ORAL | 5 refills | Status: DC
  Filled 2022-12-25: qty 60, 30d supply, fill #0
  Filled 2023-02-05: qty 60, 30d supply, fill #1
  Filled 2023-03-31: qty 180, 90d supply, fill #2
  Filled 2023-03-31: qty 60, 30d supply, fill #2

## 2022-12-25 MED ORDER — FUROSEMIDE 20 MG PO TABS
20.0000 mg | ORAL_TABLET | Freq: Every day | ORAL | 5 refills | Status: DC
  Filled 2022-12-25: qty 30, 30d supply, fill #0
  Filled 2023-02-05: qty 30, 30d supply, fill #1

## 2022-12-25 NOTE — Assessment & Plan Note (Addendum)
Chronic leg swelling, likely due to prolonged standing Leg elevation as tolerated Lasix every other day as needed Follow low-salt diet

## 2022-12-25 NOTE — Assessment & Plan Note (Signed)
Diet modification and moderate exercise/walking at least 150 mins/week Had weight loss with Ozempic, but gained after stopping it
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
## 2022-12-25 NOTE — Patient Instructions (Signed)
Please start taking Vitamin B12 500 mcg once daily.  Please continue taking medications as prescribed.  Please continue to follow low carb diet and perform moderate exercise/walking at least 150 mins/week.

## 2022-12-25 NOTE — Assessment & Plan Note (Signed)
On Pravastatin Check lipid profile 

## 2022-12-25 NOTE — Assessment & Plan Note (Signed)
BP Readings from Last 1 Encounters:  12/25/22 134/87   Well-controlled with Amlodipine, Benazepril and Hydralazine Takes Lasix 20 mg QOD for leg swelling Counseled for compliance with the medications Advised DASH diet and moderate exercise/walking, at least 150 mins/week

## 2022-12-25 NOTE — Assessment & Plan Note (Signed)
Lab Results  Component Value Date   HGBA1C 6.0 07/25/2022   HGBA1C 6.0 07/25/2022   HGBA1C 6.0 07/25/2022   Associated with HTN and HLD Well-controlled On Metformin Had diarrhea with Mounjaro and Ozempic Advised to follow diabetic diet On statin and ACEi F/u CMP and lipid panel Diabetic eye exam: Advised to follow up with Ophthalmology for diabetic eye exam

## 2022-12-25 NOTE — Assessment & Plan Note (Signed)
Does not use CPAP as masks don't fit. Advised to get nasal pillow. Discussed about possible alternatives including dental procedure as treatment.

## 2022-12-26 ENCOUNTER — Other Ambulatory Visit (HOSPITAL_COMMUNITY): Payer: Self-pay

## 2022-12-26 LAB — CMP14+EGFR
ALT: 21 IU/L (ref 0–44)
AST: 15 IU/L (ref 0–40)
Albumin/Globulin Ratio: 1.6 (ref 1.2–2.2)
Albumin: 4.4 g/dL (ref 3.8–4.9)
Alkaline Phosphatase: 53 IU/L (ref 44–121)
BUN/Creatinine Ratio: 14 (ref 9–20)
BUN: 17 mg/dL (ref 6–24)
Bilirubin Total: 0.4 mg/dL (ref 0.0–1.2)
CO2: 24 mmol/L (ref 20–29)
Calcium: 9.7 mg/dL (ref 8.7–10.2)
Chloride: 104 mmol/L (ref 96–106)
Creatinine, Ser: 1.23 mg/dL (ref 0.76–1.27)
Globulin, Total: 2.8 g/dL (ref 1.5–4.5)
Glucose: 111 mg/dL — ABNORMAL HIGH (ref 70–99)
Potassium: 4.6 mmol/L (ref 3.5–5.2)
Sodium: 142 mmol/L (ref 134–144)
Total Protein: 7.2 g/dL (ref 6.0–8.5)
eGFR: 70 mL/min/{1.73_m2} (ref >59)

## 2022-12-26 LAB — HEMOGLOBIN A1C
Est. average glucose Bld gHb Est-mCnc: 137 mg/dL
Hgb A1c MFr Bld: 6.4 % — ABNORMAL HIGH (ref 4.8–5.6)

## 2022-12-26 LAB — LIPID PANEL
Chol/HDL Ratio: 3.3 ratio (ref 0.0–5.0)
Cholesterol, Total: 130 mg/dL (ref 100–199)
HDL: 40 mg/dL (ref >39)
LDL Chol Calc (NIH): 75 mg/dL (ref 0–99)
Triglycerides: 72 mg/dL (ref 0–149)
VLDL Cholesterol Cal: 15 mg/dL (ref 5–40)

## 2023-01-01 ENCOUNTER — Other Ambulatory Visit: Payer: Self-pay

## 2023-01-01 ENCOUNTER — Observation Stay (HOSPITAL_COMMUNITY)
Admission: EM | Admit: 2023-01-01 | Discharge: 2023-01-02 | Disposition: A | Discharge status: Home / Self Care | Payer: Commercial Managed Care - PPO | Attending: Internal Medicine | Admitting: Internal Medicine

## 2023-01-01 ENCOUNTER — Emergency Department (HOSPITAL_COMMUNITY): Payer: Commercial Managed Care - PPO

## 2023-01-01 ENCOUNTER — Encounter (HOSPITAL_COMMUNITY): Payer: Self-pay | Admitting: *Deleted

## 2023-01-01 DIAGNOSIS — G4733 Obstructive sleep apnea (adult) (pediatric): Secondary | ICD-10-CM | POA: Diagnosis present

## 2023-01-01 DIAGNOSIS — R7989 Other specified abnormal findings of blood chemistry: Secondary | ICD-10-CM | POA: Diagnosis not present

## 2023-01-01 DIAGNOSIS — E782 Mixed hyperlipidemia: Secondary | ICD-10-CM | POA: Diagnosis not present

## 2023-01-01 DIAGNOSIS — Z7984 Long term (current) use of oral hypoglycemic drugs: Secondary | ICD-10-CM | POA: Diagnosis not present

## 2023-01-01 DIAGNOSIS — I10 Essential (primary) hypertension: Secondary | ICD-10-CM | POA: Diagnosis present

## 2023-01-01 DIAGNOSIS — R0789 Other chest pain: Secondary | ICD-10-CM | POA: Diagnosis not present

## 2023-01-01 DIAGNOSIS — Z7952 Long term (current) use of systemic steroids: Secondary | ICD-10-CM | POA: Insufficient documentation

## 2023-01-01 DIAGNOSIS — E876 Hypokalemia: Secondary | ICD-10-CM | POA: Insufficient documentation

## 2023-01-01 DIAGNOSIS — Z7982 Long term (current) use of aspirin: Secondary | ICD-10-CM | POA: Insufficient documentation

## 2023-01-01 DIAGNOSIS — Z79899 Other long term (current) drug therapy: Secondary | ICD-10-CM | POA: Diagnosis not present

## 2023-01-01 DIAGNOSIS — R079 Chest pain, unspecified: Secondary | ICD-10-CM

## 2023-01-01 DIAGNOSIS — N179 Acute kidney failure, unspecified: Secondary | ICD-10-CM | POA: Insufficient documentation

## 2023-01-01 DIAGNOSIS — E1165 Type 2 diabetes mellitus with hyperglycemia: Secondary | ICD-10-CM | POA: Diagnosis not present

## 2023-01-01 DIAGNOSIS — R0602 Shortness of breath: Secondary | ICD-10-CM | POA: Diagnosis not present

## 2023-01-01 DIAGNOSIS — E1169 Type 2 diabetes mellitus with other specified complication: Secondary | ICD-10-CM | POA: Diagnosis present

## 2023-01-01 LAB — CBC WITH DIFFERENTIAL/PLATELET

## 2023-01-01 NOTE — ED Triage Notes (Signed)
Pt with sudden sob while watching TV. C/o hot to mid chest.

## 2023-01-02 ENCOUNTER — Observation Stay (HOSPITAL_BASED_OUTPATIENT_CLINIC_OR_DEPARTMENT_OTHER): Payer: Commercial Managed Care - PPO

## 2023-01-02 ENCOUNTER — Emergency Department (HOSPITAL_COMMUNITY): Payer: Commercial Managed Care - PPO

## 2023-01-02 DIAGNOSIS — R0789 Other chest pain: Principal | ICD-10-CM

## 2023-01-02 DIAGNOSIS — I10 Essential (primary) hypertension: Secondary | ICD-10-CM | POA: Diagnosis not present

## 2023-01-02 DIAGNOSIS — E1165 Type 2 diabetes mellitus with hyperglycemia: Secondary | ICD-10-CM | POA: Diagnosis not present

## 2023-01-02 DIAGNOSIS — G4733 Obstructive sleep apnea (adult) (pediatric): Secondary | ICD-10-CM | POA: Diagnosis not present

## 2023-01-02 DIAGNOSIS — E876 Hypokalemia: Secondary | ICD-10-CM

## 2023-01-02 DIAGNOSIS — N179 Acute kidney failure, unspecified: Secondary | ICD-10-CM

## 2023-01-02 DIAGNOSIS — R079 Chest pain, unspecified: Secondary | ICD-10-CM

## 2023-01-02 DIAGNOSIS — E782 Mixed hyperlipidemia: Secondary | ICD-10-CM | POA: Diagnosis not present

## 2023-01-02 DIAGNOSIS — R0602 Shortness of breath: Secondary | ICD-10-CM | POA: Diagnosis not present

## 2023-01-02 LAB — NM MYOCAR MULTI W/SPECT W/WALL MOTION / EF
Base ST Depression (mm): 0 mm
LV dias vol: 143 mL (ref 62–150)
LV sys vol: 56 mL
Nuc Stress EF: 61 %
Peak HR: 106 {beats}/min
RATE: 0.3
Rest HR: 80 {beats}/min
Rest Nuclear Isotope Dose: 10 mCi
SDS: 0
SRS: 0
SSS: 0
ST Depression (mm): 0 mm
Stress Nuclear Isotope Dose: 27 mCi
TID: 1.27

## 2023-01-02 LAB — BASIC METABOLIC PANEL
Anion gap: 7 (ref 5–15)
Anion gap: 9 (ref 5–15)
BUN: 20 mg/dL (ref 6–20)
BUN: 20 mg/dL (ref 6–20)
CO2: 23 mmol/L (ref 22–32)
CO2: 25 mmol/L (ref 22–32)
Calcium: 8.7 mg/dL — ABNORMAL LOW (ref 8.9–10.3)
Calcium: 8.8 mg/dL — ABNORMAL LOW (ref 8.9–10.3)
Chloride: 104 mmol/L (ref 98–111)
Chloride: 105 mmol/L (ref 98–111)
Creatinine, Ser: 1.21 mg/dL (ref 0.61–1.24)
Creatinine, Ser: 1.56 mg/dL — ABNORMAL HIGH (ref 0.61–1.24)
GFR, Estimated: 52 mL/min — ABNORMAL LOW (ref >60)
GFR, Estimated: >60 mL/min (ref >60)
Glucose, Bld: 194 mg/dL — ABNORMAL HIGH (ref 70–99)
Glucose, Bld: 95 mg/dL (ref 70–99)
Potassium: 3.1 mmol/L — ABNORMAL LOW (ref 3.5–5.1)
Potassium: 3.9 mmol/L (ref 3.5–5.1)
Sodium: 136 mmol/L (ref 135–145)
Sodium: 137 mmol/L (ref 135–145)

## 2023-01-02 LAB — CBC WITH DIFFERENTIAL/PLATELET
Abs Immature Granulocytes: 0 10*3/uL (ref 0.00–0.07)
Basophils Absolute: 0.2 10*3/uL — ABNORMAL HIGH (ref 0.0–0.1)
Basophils Relative: 2 %
Eosinophils Absolute: 0.3 10*3/uL (ref 0.0–0.5)
Eosinophils Relative: 3 %
HCT: 36.6 % — ABNORMAL LOW (ref 39.0–52.0)
Hemoglobin: 12 g/dL — ABNORMAL LOW (ref 13.0–17.0)
Lymphocytes Relative: 58 %
Lymphs Abs: 5.9 10*3/uL — ABNORMAL HIGH (ref 0.7–4.0)
MCH: 29.2 pg (ref 26.0–34.0)
MCHC: 32.8 g/dL (ref 30.0–36.0)
MCV: 89.1 fL (ref 80.0–100.0)
Monocytes Absolute: 1 10*3/uL (ref 0.1–1.0)
Monocytes Relative: 10 %
Neutro Abs: 2.7 10*3/uL (ref 1.7–7.7)
Neutrophils Relative %: 27 %
Platelets: 322 10*3/uL (ref 150–400)
RBC: 4.11 MIL/uL — ABNORMAL LOW (ref 4.22–5.81)
RDW: 14.4 % (ref 11.5–15.5)
WBC: 10.1 10*3/uL (ref 4.0–10.5)
nRBC: 0 % (ref 0.0–0.2)

## 2023-01-02 LAB — ECHOCARDIOGRAM COMPLETE
AR max vel: 2.7 cm2
AV Area VTI: 2.83 cm2
AV Area mean vel: 2.7 cm2
AV Mean grad: 6 mmHg
AV Peak grad: 13.7 mmHg
Ao pk vel: 1.85 m/s
Area-P 1/2: 3.4 cm2
Height: 69 in
MV VTI: 3.55 cm2
S' Lateral: 2.9 cm
Weight: 4726.66 oz

## 2023-01-02 LAB — TROPONIN I (HIGH SENSITIVITY)
Troponin I (High Sensitivity): 21 ng/L — ABNORMAL HIGH (ref <18)
Troponin I (High Sensitivity): 32 ng/L — ABNORMAL HIGH (ref <18)
Troponin I (High Sensitivity): 40 ng/L — ABNORMAL HIGH (ref <18)
Troponin I (High Sensitivity): 6 ng/L (ref <18)

## 2023-01-02 LAB — GLUCOSE, CAPILLARY
Glucose-Capillary: 89 mg/dL (ref 70–99)
Glucose-Capillary: 119 mg/dL — ABNORMAL HIGH (ref 70–99)

## 2023-01-02 LAB — MAGNESIUM: Magnesium: 1.9 mg/dL (ref 1.7–2.4)

## 2023-01-02 LAB — PHOSPHORUS: Phosphorus: 2.3 mg/dL — ABNORMAL LOW (ref 2.5–4.6)

## 2023-01-02 LAB — D-DIMER, QUANTITATIVE: D-Dimer, Quant: 0.33 ug/mL-FEU (ref 0.00–0.50)

## 2023-01-02 LAB — CBG MONITORING, ED: Glucose-Capillary: 82 mg/dL (ref 70–99)

## 2023-01-02 MED ORDER — ASPIRIN 81 MG PO TBEC
81.0000 mg | DELAYED_RELEASE_TABLET | Freq: Every day | ORAL | Status: DC
  Filled 2023-01-02: qty 1

## 2023-01-02 MED ORDER — ENOXAPARIN SODIUM 40 MG/0.4ML IJ SOSY
40.0000 mg | PREFILLED_SYRINGE | INTRAMUSCULAR | Status: DC
  Filled 2023-01-02: qty 0.4

## 2023-01-02 MED ORDER — ASPIRIN 81 MG PO CHEW
324.0000 mg | CHEWABLE_TABLET | Freq: Once | ORAL | Status: AC
  Administered 2023-01-02: 324 mg via ORAL
  Filled 2023-01-02: qty 4

## 2023-01-02 MED ORDER — POTASSIUM CHLORIDE CRYS ER 20 MEQ PO TBCR
40.0000 meq | EXTENDED_RELEASE_TABLET | Freq: Once | ORAL | Status: AC
  Administered 2023-01-02: 40 meq via ORAL
  Filled 2023-01-02: qty 2

## 2023-01-02 MED ORDER — ONDANSETRON HCL 4 MG PO TABS: 4.0000 mg | ORAL_TABLET | Freq: Four times a day (QID) | ORAL | Status: DC | PRN

## 2023-01-02 MED ORDER — KETOROLAC TROMETHAMINE 30 MG/ML IJ SOLN
30.0000 mg | Freq: Once | INTRAMUSCULAR | Status: AC
  Administered 2023-01-02: 30 mg via INTRAVENOUS
  Filled 2023-01-02: qty 1

## 2023-01-02 MED ORDER — TECHNETIUM TC 99M TETROFOSMIN IV KIT
10.0000 | PACK | Freq: Once | INTRAVENOUS | Status: AC | PRN
  Administered 2023-01-02: 10 via INTRAVENOUS

## 2023-01-02 MED ORDER — IOHEXOL 350 MG/ML SOLN
100.0000 mL | Freq: Once | INTRAVENOUS | Status: AC | PRN
  Administered 2023-01-02: 75 mL via INTRAVENOUS

## 2023-01-02 MED ORDER — ACETAMINOPHEN 650 MG RE SUPP: 650.0000 mg | Freq: Four times a day (QID) | RECTAL | Status: DC | PRN

## 2023-01-02 MED ORDER — ASPIRIN 81 MG PO TBEC: 81.0000 mg | DELAYED_RELEASE_TABLET | Freq: Every day | ORAL | Status: DC

## 2023-01-02 MED ORDER — TECHNETIUM TC 99M TETROFOSMIN IV KIT
30.0000 | PACK | Freq: Once | INTRAVENOUS | Status: AC | PRN
  Administered 2023-01-02: 27 via INTRAVENOUS

## 2023-01-02 MED ORDER — PRAVASTATIN SODIUM 40 MG PO TABS
40.0000 mg | ORAL_TABLET | Freq: Every day | ORAL | Status: DC
  Administered 2023-01-02: 40 mg via ORAL
  Filled 2023-01-02: qty 1

## 2023-01-02 MED ORDER — PERFLUTREN LIPID MICROSPHERE
1.0000 mL | INTRAVENOUS | Status: AC | PRN
  Administered 2023-01-02: 5 mL via INTRAVENOUS

## 2023-01-02 MED ORDER — ONDANSETRON HCL 4 MG/2ML IJ SOLN: 4.0000 mg | Freq: Four times a day (QID) | INTRAMUSCULAR | Status: DC | PRN

## 2023-01-02 MED ORDER — ACETAMINOPHEN 325 MG PO TABS: 650.0000 mg | ORAL_TABLET | Freq: Four times a day (QID) | ORAL | Status: DC | PRN

## 2023-01-02 MED ORDER — INSULIN ASPART 100 UNIT/ML IJ SOLN: 0.0000 [IU] | Freq: Every day | INTRAMUSCULAR | Status: DC

## 2023-01-02 MED ORDER — INSULIN ASPART 100 UNIT/ML IJ SOLN: 0.0000 [IU] | Freq: Three times a day (TID) | INTRAMUSCULAR | Status: DC

## 2023-01-02 MED ORDER — SODIUM CHLORIDE FLUSH 0.9 % IV SOLN
INTRAVENOUS | Status: AC
  Administered 2023-01-02: 10 mL via INTRAVENOUS
  Filled 2023-01-02: qty 10

## 2023-01-02 MED ORDER — REGADENOSON 0.4 MG/5ML IV SOLN
INTRAVENOUS | Status: AC
  Administered 2023-01-02: 0.4 mg via INTRAVENOUS
  Filled 2023-01-02: qty 5

## 2023-01-02 NOTE — ED Notes (Signed)
Pt resting in bed, non-labored respirations. Bed in low locked position with side rails up x2 and call bell in reach. On CCM. Wife at bedside. No s/s of acute distress, states no pain

## 2023-01-02 NOTE — Plan of Care (Signed)

## 2023-01-02 NOTE — Progress Notes (Signed)
Patient discharged home today, transported home by wife. Discharge paperwork went over with patient, patient verbalized understanding. Belongings sent home with patient.  
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
## 2023-01-02 NOTE — Progress Notes (Signed)
  Transition of Care [facility]) Screening Note   Patient Details  Name: Armando Bukhari Date of Birth: 07-31-1966   Transition of Care [facility]) CM/SW Contact:    Iona Beard, [place] Phone Number: 01/02/2023, 10:46 AM    Transition of Care Department [facility]) has reviewed patient and no TOC needs have been identified at this time. We will continue to monitor patient advancement through interdisciplinary progression rounds. If new patient transition needs arise, please place a TOC consult.

## 2023-01-02 NOTE — Discharge Summary (Signed)
Physician Discharge Summary  James Murphy IRS:[id number] DOB: 02-02-1966 DOA: 01/01/2023  PCP: Lindell Spar, MD  Admit date: 01/01/2023  Discharge date: 01/02/2023  Admitted From:Home  Disposition:  Home  Recommendations for Outpatient Follow-up:  Follow up with PCP in 1-2 weeks Follow-up with cardiology as scheduled on 2/12 at [facility] home medications as prior  Home Health: None  Equipment/Devices: None  Discharge Condition:Stable  CODE STATUS: Full  Diet recommendation: Heart Healthy/carb modified  Brief/Interim Summary: James Murphy is a 57 y.o. male with medical history significant of hypertension, hyperlipidemia, type 2 diabetes mellitus, obstructive sleep apnea, obesity who presents to the emergency department due to sudden onset of chest pain which occurred at home while sitting on a couch.  He was admitted for evaluation of atypical chest pain and underwent stress study that was within normal limits.  He no longer has any pain and is in stable condition for discharge from cardiology standpoint and will follow-up as noted above.  No other acute events or concerns noted during the course of the stay.  Discharge Diagnoses:  Principal Problem:   Atypical chest pain Active Problems:   Essential hypertension   Uncontrolled type 2 diabetes mellitus with hyperglycemia, without long-term current use of insulin (HCC)   Morbid obesity (HCC)   Mixed hyperlipidemia   OSA (obstructive sleep apnea)   Hypokalemia   AKI (acute kidney injury) ([place])  Principal discharge diagnosis: Atypical chest pain with negative cardiac stress test, likely musculoskeletal cause.  Discharge Instructions  Discharge Instructions     Diet - low sodium heart healthy   Complete by: As directed    Increase activity slowly   Complete by: As directed       Allergies as of 01/02/2023       Reactions   Molds & Smuts    Ozempic (0.25 Or 0.5 Mg-dose) [semaglutide(0.25 Or 0.5mg -dos)]  Diarrhea   Severe nausea/vomiting and diarrhea        Medication List     TAKE these medications    amLODipine 10 MG tablet Commonly known as: NORVASC Take 1 tablet (10 mg total) by mouth daily.   aspirin 81 MG tablet Take 81 mg by mouth daily.   benazepril 40 MG tablet Commonly known as: LOTENSIN Take 1 tablet (40 mg total) by mouth daily.   bismuth subsalicylate 009 FG/18EX suspension Commonly known as: PEPTO BISMOL Take 30 mLs by mouth every 6 (six) hours as needed for diarrhea or loose stools.   Blood Glucose System Pak Kit Please dispense based on patient and insurance preference. Use as directed to monitor FSBS 3x weekly. Dx: E11.9.   BLOOD GLUCOSE TEST STRIPS Strp Please dispense based on patient and insurance preference. Use as directed to monitor FSBS 3x weekly. Dx: E11.9.   cetirizine 10 MG tablet Commonly known as: ZYRTEC Take 1 tablet (10 mg total) by mouth daily.   fluticasone 50 MCG/ACT nasal spray Commonly known as: FLONASE Place 2 sprays into both nostrils daily.   furosemide 20 MG tablet Commonly known as: LASIX Take 1 tablet (20 mg total) by mouth daily.   hydrALAZINE 10 MG tablet Commonly known as: APRESOLINE Take 1 tablet (10 mg total) by mouth 2 (two) times daily.   Lancets Misc Please dispense based on patient and insurance preference. Use as directed to monitor FSBS 3x weekly. Dx: E11.9.   metFORMIN 500 MG tablet Commonly known as: GLUCOPHAGE Take 1 tablet (500 mg total) by mouth daily with breakfast.   ondansetron  4 MG disintegrating tablet Commonly known as: ZOFRAN-ODT Take 1 tablet (4 mg total) by mouth every 8 (eight) hours as needed for nausea or vomiting.   pravastatin 40 MG tablet Commonly known as: PRAVACHOL Take 1 tablet (40 mg total) by mouth daily.   sodium chloride 0.65 % nasal spray Commonly known as: OCEAN Place 1 spray into both nostrils as needed for congestion        Follow-up Information     Lindell Spar, MD. Schedule an appointment as soon as possible for a visit in 1 week(s).   Specialty: Internal Medicine Contact information: [address] [place] [address] [phone]         Finis Bud, NP. Go on 02/03/2023.   Specialty: Cardiology Why: At 930am Contact information: [place] [address] [phone]                Allergies  Allergen Reactions   Molds & Smuts    Ozempic (0.25 Or 0.5 Mg-Dose) [Semaglutide(0.25 Or 0.5mg -Dos)] Diarrhea    Severe nausea/vomiting and diarrhea    Consultations: Cardiology   Procedures/Studies: NM Myocar Multi W/Spect W/Wall Motion / EF  Result Date: 01/02/2023   No ST deviation was noted during pharmacological stress.   LV perfusion is normal. There is no evidence of ischemia. There is no evidence of infarction.   Left ventricular function is normal.   The study is normal. The study is low risk.   CT Angio Chest PE W and/or Wo Contrast  Result Date: 01/02/2023 CLINICAL DATA:  Shortness of breath and weakness. EXAM: CT ANGIOGRAPHY CHEST WITH CONTRAST TECHNIQUE: Multidetector CT imaging of the chest was performed using the standard protocol during bolus administration of intravenous contrast. Multiplanar CT image reconstructions and MIPs were obtained to evaluate the vascular anatomy. RADIATION DOSE REDUCTION: This exam was performed according to the departmental dose-optimization program which includes automated exposure control, adjustment of the mA and/or kV according to patient size and/or use of iterative reconstruction technique. CONTRAST:  64mL OMNIPAQUE IOHEXOL 350 MG/ML SOLN COMPARISON:  None Available. FINDINGS: Cardiovascular: A thoracic aorta is normal in appearance. The subsegmental pulmonary arteries are limited in evaluation secondary to suboptimal opacification with intravenous contrast. No evidence of pulmonary embolism. Normal heart size. No pericardial effusion. Mediastinum/Nodes: No  enlarged mediastinal, hilar, or axillary lymph nodes. Thyroid gland, trachea, and esophagus demonstrate no significant findings. Lungs/Pleura: There is no evidence of an acute infiltrate, pleural effusion or pneumothorax. Upper Abdomen: A 2.2 cm diameter simple cyst is seen within the upper pole of the right kidney. Musculoskeletal: No chest wall abnormality. No acute or significant osseous findings. Review of the MIP images confirms the above findings. IMPRESSION: 1. No evidence of pulmonary embolism or other acute intrathoracic process. 2. 2.2 cm diameter simple cyst within the upper pole of the right kidney. No follow-up imaging is recommended. This recommendation follows ACR consensus guidelines: Management of the Incidental Renal Mass on CT: A White Paper of the ACR Incidental Findings Committee. J Am Coll Radiol [phone]. Electronically Signed   By: Virgina Norfolk M.D.   On: 01/02/2023 01:44   DG Chest Portable 1 View  Result Date: 01/01/2023 CLINICAL DATA:  Shortness breath EXAM: PORTABLE CHEST 1 VIEW COMPARISON:  01/17/2014 FINDINGS: The heart size and mediastinal contours are within normal limits. Both lungs are clear. The visualized skeletal structures are unremarkable. IMPRESSION: No active disease. Electronically Signed   By: Donavan Foil M.D.   On:  01/01/2023 23:12     Discharge Exam: Vitals:   01/02/23 0900 01/02/23 1445  BP: (!) 141/93 137/88  Pulse: 85 81  Resp: 18 18  Temp: 98 F (36.7 C) 98.5 F (36.9 C)  SpO2: 99% 100%   Vitals:   01/02/23 0830 01/02/23 0859 01/02/23 0900 01/02/23 1445  BP: 130/75  (!) 141/93 137/88  Pulse: 86  85 81  Resp: 15  18 18   Temp:   98 F (36.7 C) 98.5 F (36.9 C)  TempSrc:   Oral Oral  SpO2: 100%  99% 100%  Weight:  134 kg    Height:  5\' 9"  (1.753 m)      General: Pt is alert, awake, not in acute distress Cardiovascular: RRR, S1/S2 +, no rubs, no gallops Respiratory: CTA bilaterally, no wheezing, no rhonchi Abdominal: Soft,  NT, ND, bowel sounds + Extremities: no edema, no cyanosis    The results of significant diagnostics from this hospitalization (including imaging, microbiology, ancillary and laboratory) are listed below for reference.     Microbiology: No results found for this or any previous visit (from the past 240 hour(s)).   Labs: BNP (last 3 results) No results for input(s): "BNP" in the last 8760 hours. Basic Metabolic Panel: Recent Labs  Lab 01/01/23 2335  NA 136  K 3.1*  CL 104  CO2 23  GLUCOSE 194*  BUN 20  CREATININE 1.56*  CALCIUM 8.8*  MG 1.9  PHOS 2.3*   Liver Function Tests: No results for input(s): "AST", "ALT", "ALKPHOS", "BILITOT", "PROT", "ALBUMIN" in the last 168 hours. No results for input(s): "LIPASE", "AMYLASE" in the last 168 hours. No results for input(s): "AMMONIA" in the last 168 hours. CBC: Recent Labs  Lab 01/01/23 2335  WBC 10.1  NEUTROABS 2.7  HGB 12.0*  HCT 36.6*  MCV 89.1  PLT 322   Cardiac Enzymes: No results for input(s): "CKTOTAL", "CKMB", "CKMBINDEX", "TROPONINI" in the last 168 hours. BNP: Invalid input(s): "POCBNP" CBG: Recent Labs  Lab 01/02/23 0810 01/02/23 1301  GLUCAP 82 89   D-Dimer Recent Labs    01/01/23 2335  DDIMER 0.33   Hgb A1c No results for input(s): "HGBA1C" in the last 72 hours. Lipid Profile No results for input(s): "CHOL", "HDL", "LDLCALC", "TRIG", "CHOLHDL", "LDLDIRECT" in the last 72 hours. Thyroid function studies No results for input(s): "TSH", "T4TOTAL", "T3FREE", "THYROIDAB" in the last 72 hours.  Invalid input(s): "FREET3" Anemia work up No results for input(s): "VITAMINB12", "FOLATE", "FERRITIN", "TIBC", "IRON", "RETICCTPCT" in the last 72 hours. Urinalysis    Component Value Date/Time   COLORURINE YELLOW 04/06/2022 0836   APPEARANCEUR CLEAR 04/06/2022 0836   LABSPEC >1.046 (H) 04/06/2022 0836   PHURINE 5.0 04/06/2022 0836   GLUCOSEU NEGATIVE 04/06/2022 0836   HGBUR NEGATIVE 04/06/2022 0836    BILIRUBINUR NEGATIVE 04/06/2022 0836   KETONESUR NEGATIVE 04/06/2022 0836   PROTEINUR NEGATIVE 04/06/2022 0836   UROBILINOGEN 0.2 04/04/2022 0938   NITRITE NEGATIVE 04/06/2022 0836   LEUKOCYTESUR NEGATIVE 04/06/2022 0836   Sepsis Labs Recent Labs  Lab 01/01/23 2335  WBC 10.1   Microbiology No results found for this or any previous visit (from the past 240 hour(s)).   Time coordinating discharge: 35 minutes  SIGNED:   04/08/2022, DO [facility] 01/02/2023, 3:22 PM  If 7PM-7AM, please contact night-coverage [email or web address]

## 2023-01-02 NOTE — ED Provider Notes (Signed)
[facility] EMERGENCY DEPARTMENT Provider Note   CSN: [id number] Arrival date & time: 01/01/23  2227     History  Chief Complaint  Patient presents with   Shortness of Breath    Ayush Boulet is a 57 y.o. male.  Patient is a 57 year old male with past medical history with past medical history of hypertension, type 2 diabetes, hyperlipidemia, obstructive sleep apnea.  Patient presenting today for evaluation of chest pain.  He states he was sitting at home this evening when he got the sudden onset of heaviness in his chest along with feelings of shortness of breath.  He denies any fevers, chills, or cough.  He denies any nausea or diaphoresis.  Patient has no history of breathing issues and no prior cardiac history.  The history is provided by the patient.       Home Medications Prior to Admission medications   Medication Sig Start Date End Date Taking? Authorizing Provider  amLODipine (NORVASC) 10 MG tablet Take 1 tablet (10 mg total) by mouth daily. 06/14/22   Anabel Halon, MD  aspirin 81 MG tablet Take 81 mg by mouth daily.    [provider]  benazepril (LOTENSIN) 40 MG tablet Take 1 tablet (40 mg total) by mouth daily. 06/14/22   Anabel Halon, MD  bismuth subsalicylate (PEPTO BISMOL) 262 MG/15ML suspension Take 30 mLs by mouth every 6 (six) hours as needed for diarrhea or loose stools.    [provider]  Blood Glucose Monitoring Suppl (BLOOD GLUCOSE SYSTEM PAK) KIT Please dispense based on patient and insurance preference. Use as directed to monitor FSBS 3x weekly. Dx: E11.9. 01/07/19   Salley Scarlet, MD  cetirizine (ZYRTEC) 10 MG tablet Take 1 tablet (10 mg total) by mouth daily. 02/08/22   Paseda, Baird Kay, FNP  fluticasone (FLONASE) 50 MCG/ACT nasal spray Place 2 sprays into both nostrils daily. 02/08/22   Paseda, Baird Kay, FNP  furosemide (LASIX) 20 MG tablet Take 1 tablet (20 mg total) by mouth daily. 12/25/22   Anabel Halon, MD  Glucose Blood  (BLOOD GLUCOSE TEST STRIPS) STRP Please dispense based on patient and insurance preference. Use as directed to monitor FSBS 3x weekly. Dx: E11.9. 01/07/19   Salley Scarlet, MD  hydrALAZINE (APRESOLINE) 10 MG tablet Take 1 tablet (10 mg total) by mouth 2 (two) times daily. 12/25/22   Anabel Halon, MD  Lancets MISC Please dispense based on patient and insurance preference. Use as directed to monitor FSBS 3x weekly. Dx: E11.9. 01/07/19   Salley Scarlet, MD  metFORMIN (GLUCOPHAGE) 500 MG tablet Take 1 tablet (500 mg total) by mouth daily with breakfast. 11/05/22   Anabel Halon, MD  ondansetron (ZOFRAN-ODT) 4 MG disintegrating tablet Take 1 tablet (4 mg total) by mouth every 8 (eight) hours as needed for nausea or vomiting. 04/04/22   Raspet, Denny Peon K, PA-C  pravastatin (PRAVACHOL) 40 MG tablet Take 1 tablet (40 mg total) by mouth daily. 06/14/22   Anabel Halon, MD  sodium chloride (OCEAN) 0.65 % nasal spray Place 1 spray into both nostrils as needed for congestion 02/08/22   Paseda, Baird Kay, FNP      Allergies    Molds & smuts and Ozempic (0.25 or 0.5 mg-dose) [semaglutide(0.25 or 0.5mg -dos)]    Review of Systems   Review of Systems  All other systems reviewed and are negative.   Physical Exam Updated Vital Signs BP 108/62   Pulse (!) 109  Temp 98.5 F (36.9 C) (Oral)   Resp 19   Ht 5\' 9"  (1.753 m)   Wt 135.2 kg   SpO2 96%   BMI 44.01 kg/m  Physical Exam Vitals and nursing note reviewed.  Constitutional:      General: He is not in acute distress.    Appearance: He is well-developed. He is not diaphoretic.  HENT:     Head: Normocephalic and atraumatic.  Cardiovascular:     Rate and Rhythm: Normal rate and regular rhythm.     Heart sounds: No murmur heard.    No friction rub.  Pulmonary:     Effort: Pulmonary effort is normal. No respiratory distress.     Breath sounds: Normal breath sounds. No wheezing or rales.  Abdominal:     General: Bowel sounds are normal. There  is no distension.     Palpations: Abdomen is soft.     Tenderness: There is no abdominal tenderness.  Musculoskeletal:        General: Normal range of motion.     Cervical back: Normal range of motion and neck supple.     Right lower leg: No tenderness. No edema.     Left lower leg: No tenderness. No edema.  Skin:    General: Skin is warm and dry.  Neurological:     Mental Status: He is alert and oriented to person, place, and time.     Coordination: Coordination normal.     ED Results / Procedures / Treatments   Labs (all labs ordered are listed, but only abnormal results are displayed) Labs Reviewed  CBC WITH DIFFERENTIAL/PLATELET - Abnormal; Notable for the following components:      Result Value   RBC 4.11 (*)    Hemoglobin 12.0 (*)    HCT 36.6 (*)    All other components within normal limits  BASIC METABOLIC PANEL  D-DIMER, QUANTITATIVE  TROPONIN I (HIGH SENSITIVITY)    EKG EKG Interpretation  Date/Time:  Wednesday January 01 2023 22:39:33 EST Ventricular Rate:  121 PR Interval:  136 QRS Duration: 109 QT Interval:  320 QTC Calculation: 454 R Axis:   65 Text Interpretation: Sinus tachycardia Probable LVH with secondary repol abnrm Inferior infarct, age indeterminate Baseline wander in lead(s) V2 Confirmed by Veryl Speak [phone]) on 01/02/2023 12:07:46 AM  Radiology DG Chest Portable 1 View  Result Date: 01/01/2023 CLINICAL DATA:  Shortness breath EXAM: PORTABLE CHEST 1 VIEW COMPARISON:  01/17/2014 FINDINGS: The heart size and mediastinal contours are within normal limits. Both lungs are clear. The visualized skeletal structures are unremarkable. IMPRESSION: No active disease. Electronically Signed   By: Donavan Foil M.D.   On: 01/01/2023 23:12    Procedures Procedures    Medications Ordered in ED Medications - No data to display  ED Course/ Medical Decision Making/ A&P  Patient is a 57 year old male presenting with complaints of chest discomfort as  described in the HPI.  This started this evening while at rest at home.  Patient arrives here with stable vital signs, no hypoxia, and no fever.  Physical examination basically unremarkable.  Workup initiated including CBC, metabolic panel, and troponin.  CBC and metabolic panel unremarkable and initial troponin was also negative.  D-dimer also obtained and was negative.  Chest x-ray shows no acute process.  Due to the acute onset of his chest pain, I did elect to obtain a PE study, however there was no evidence for pulmonary embolism or other acute process within the thoracic cavity.  EKG shows a sinus tachycardia with perhaps subtle T wave abnormalities.  Patient was given IV Toradol with some relief.  Patient's second troponin has increased from 6-21.  I am uncertain as to the significance of this increase, so care was discussed with Dr. Foye Clock who was the fellow on-call for cardiology.  He and I are in agreement that patient should be admitted for overnight observation and trending of his troponin.  Patient also given aspirin, but no heparin recommended at this point.  He may benefit from cardiology consultation in the morning.  I have spoken with Dr. Josephine Cables who agrees to admit.  Final Clinical Impression(s) / ED Diagnoses Final diagnoses:  None    Rx / DC Orders ED Discharge Orders     None         Veryl Speak, MD 01/02/23 0405

## 2023-01-02 NOTE — Progress Notes (Signed)
*  PRELIMINARY RESULTS* Echocardiogram 2D Echocardiogram has been performed.  James Murphy 01/02/2023, 2:46 PM
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
## 2023-01-03 LAB — HIV ANTIBODY (ROUTINE TESTING W REFLEX): HIV Screen 4th Generation wRfx: NONREACTIVE

## 2023-02-03 ENCOUNTER — Ambulatory Visit: Payer: Commercial Managed Care - PPO | Admitting: Nurse Practitioner

## 2023-02-05 ENCOUNTER — Ambulatory Visit: Payer: Commercial Managed Care - PPO | Admitting: Family Medicine

## 2023-02-05 ENCOUNTER — Encounter: Payer: Self-pay | Admitting: Family Medicine

## 2023-02-05 ENCOUNTER — Other Ambulatory Visit (HOSPITAL_COMMUNITY): Payer: Self-pay

## 2023-02-05 VITALS — BP 125/86 | HR 110 | Temp 99.7°F | Ht 69.0 in | Wt 291.0 lb

## 2023-02-05 DIAGNOSIS — J069 Acute upper respiratory infection, unspecified: Secondary | ICD-10-CM | POA: Diagnosis not present

## 2023-02-05 MED ORDER — AMOXICILLIN-POT CLAVULANATE 875-125 MG PO TABS
1.0000 | ORAL_TABLET | Freq: Two times a day (BID) | ORAL | 0 refills | Status: DC
  Filled 2023-02-05: qty 14, 7d supply, fill #0

## 2023-02-05 MED ORDER — PROMETHAZINE-DM 6.25-15 MG/5ML PO SYRP
1.2500 mL | ORAL_SOLUTION | Freq: Four times a day (QID) | ORAL | 0 refills | Status: DC | PRN
  Filled 2023-02-05: qty 118, 23d supply, fill #0

## 2023-02-05 MED ORDER — LEVOCETIRIZINE DIHYDROCHLORIDE 5 MG PO TABS
5.0000 mg | ORAL_TABLET | Freq: Every evening | ORAL | 1 refills | Status: DC
  Filled 2023-02-05: qty 15, 15d supply, fill #0
  Filled 2023-05-15: qty 15, 15d supply, fill #1

## 2023-02-05 MED ORDER — ALBUTEROL SULFATE HFA 108 (90 BASE) MCG/ACT IN AERS
2.0000 | INHALATION_SPRAY | Freq: Four times a day (QID) | RESPIRATORY_TRACT | 2 refills | Status: AC | PRN
  Filled 2023-02-05: qty 6.7, 25d supply, fill #0

## 2023-02-05 MED ORDER — AZELASTINE-FLUTICASONE 137-50 MCG/ACT NA SUSP
1.0000 | Freq: Two times a day (BID) | NASAL | 1 refills | Status: DC
  Filled 2023-02-05: qty 23, 30d supply, fill #0

## 2023-02-05 NOTE — Patient Instructions (Signed)
It was pleasure meeting with you today. Please take medications as prescribed. Follow up with your primary health provider if any health concerns arises. If symptoms worsen please contact your primary care provider and/or visit the emergency department.

## 2023-02-05 NOTE — Assessment & Plan Note (Signed)
Prescribed amoxicillin-clavulanate 875-125 mg, albuterol,Azelastine-fluticasone 137-50 mcg nasal spray, promethazine-DM, Xyzal 5 mg. Explained symptomatic treatment, rest, increase oral fluid intake. Follow-up for worsening or persistent symptoms. Patient verbalizes understanding regarding plan of care and all questions answered.
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
## 2023-02-07 ENCOUNTER — Other Ambulatory Visit (HOSPITAL_COMMUNITY): Payer: Self-pay

## 2023-02-10 ENCOUNTER — Ambulatory Visit: Payer: Commercial Managed Care - PPO | Attending: Nurse Practitioner | Admitting: Nurse Practitioner

## 2023-02-10 ENCOUNTER — Encounter: Payer: Self-pay | Admitting: Nurse Practitioner

## 2023-02-10 VITALS — BP 124/72 | HR 88 | Ht 69.0 in | Wt 291.0 lb

## 2023-02-10 DIAGNOSIS — Z8679 Personal history of other diseases of the circulatory system: Secondary | ICD-10-CM | POA: Diagnosis not present

## 2023-02-10 DIAGNOSIS — I10 Essential (primary) hypertension: Secondary | ICD-10-CM

## 2023-02-10 DIAGNOSIS — Z87898 Personal history of other specified conditions: Secondary | ICD-10-CM

## 2023-02-10 DIAGNOSIS — G4733 Obstructive sleep apnea (adult) (pediatric): Secondary | ICD-10-CM | POA: Diagnosis not present

## 2023-02-10 DIAGNOSIS — E785 Hyperlipidemia, unspecified: Secondary | ICD-10-CM | POA: Diagnosis not present

## 2023-02-10 NOTE — Patient Instructions (Signed)
Medication Instructions:  Your physician recommends that you continue on your current medications as directed. Please refer to the Current Medication list given to you today.  *If you need a refill on your cardiac medications before your next appointment, please call your pharmacy*   Lab Work: NONE   If you have labs (blood work) drawn today and your tests are completely normal, you will receive your results only by: [place] (if you have MyChart) OR A paper copy in the mail If you have any lab test that is abnormal or we need to change your treatment, we will call you to review the results.   Testing/Procedures: NONE    Follow-Up: At [facility], you and your health needs are our priority.  As part of our continuing mission to provide you with exceptional heart care, we have created designated Provider Care Teams.  These Care Teams include your primary Cardiologist (physician) and Advanced Practice Providers (APPs -  Physician Assistants and Nurse Practitioners) who all work together to provide you with the care you need, when you need it.  We recommend signing up for the patient portal called "MyChart".  Sign up information is provided on this After Visit Summary.  MyChart is used to connect with patients for Virtual Visits (Telemedicine).  Patients are able to view lab/test results, encounter notes, upcoming appointments, etc.  Non-urgent messages can be sent to your provider as well.   To learn more about what you can do with MyChart, go to [email or web address].    Your next appointment:   1 year(s)  Provider:   Claudina Lick, MD    Other Instructions Thank you for choosing [facility]!
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
## 2023-02-18 DIAGNOSIS — H524 Presbyopia: Secondary | ICD-10-CM | POA: Diagnosis not present

## 2023-02-18 DIAGNOSIS — H52223 Regular astigmatism, bilateral: Secondary | ICD-10-CM | POA: Diagnosis not present

## 2023-02-18 DIAGNOSIS — H5203 Hypermetropia, bilateral: Secondary | ICD-10-CM | POA: Diagnosis not present

## 2023-02-18 LAB — HM DIABETES EYE EXAM

## 2023-02-21 ENCOUNTER — Other Ambulatory Visit (HOSPITAL_COMMUNITY): Payer: Self-pay

## 2023-03-31 ENCOUNTER — Other Ambulatory Visit: Payer: Self-pay | Admitting: Internal Medicine

## 2023-03-31 ENCOUNTER — Other Ambulatory Visit (HOSPITAL_COMMUNITY): Payer: Self-pay

## 2023-03-31 ENCOUNTER — Other Ambulatory Visit: Payer: Self-pay

## 2023-03-31 DIAGNOSIS — E1169 Type 2 diabetes mellitus with other specified complication: Secondary | ICD-10-CM

## 2023-03-31 MED ORDER — PRAVASTATIN SODIUM 40 MG PO TABS
40.0000 mg | ORAL_TABLET | Freq: Every day | ORAL | 2 refills | Status: DC
  Filled 2023-03-31: qty 90, 90d supply, fill #0
  Filled 2023-07-09 – 2023-07-23 (×2): qty 90, 90d supply, fill #1
  Filled 2023-10-06: qty 90, 90d supply, fill #2

## 2023-04-28 ENCOUNTER — Ambulatory Visit: Payer: Commercial Managed Care - PPO | Admitting: Internal Medicine

## 2023-05-15 ENCOUNTER — Other Ambulatory Visit: Payer: Self-pay | Admitting: Internal Medicine

## 2023-05-15 ENCOUNTER — Other Ambulatory Visit (HOSPITAL_COMMUNITY): Payer: Self-pay

## 2023-05-15 DIAGNOSIS — E1169 Type 2 diabetes mellitus with other specified complication: Secondary | ICD-10-CM

## 2023-05-15 MED ORDER — METFORMIN HCL 500 MG PO TABS
500.0000 mg | ORAL_TABLET | Freq: Every day | ORAL | 1 refills | Status: DC
  Filled 2023-05-15: qty 90, 90d supply, fill #0
  Filled 2023-08-17: qty 90, 90d supply, fill #1

## 2023-06-19 ENCOUNTER — Other Ambulatory Visit (HOSPITAL_COMMUNITY): Payer: Self-pay

## 2023-06-19 DIAGNOSIS — M722 Plantar fascial fibromatosis: Secondary | ICD-10-CM | POA: Diagnosis not present

## 2023-06-19 MED ORDER — DICLOFENAC SODIUM 75 MG PO TBEC
75.0000 mg | DELAYED_RELEASE_TABLET | Freq: Two times a day (BID) | ORAL | 1 refills | Status: AC | PRN
  Filled 2023-06-19: qty 60, 30d supply, fill #0
  Filled 2023-08-17: qty 60, 30d supply, fill #1

## 2023-07-02 ENCOUNTER — Ambulatory Visit (INDEPENDENT_AMBULATORY_CARE_PROVIDER_SITE_OTHER): Payer: Commercial Managed Care - PPO | Admitting: Internal Medicine

## 2023-07-02 ENCOUNTER — Other Ambulatory Visit (HOSPITAL_COMMUNITY): Payer: Self-pay

## 2023-07-02 ENCOUNTER — Encounter: Payer: Self-pay | Admitting: Internal Medicine

## 2023-07-02 VITALS — BP 129/83 | HR 85 | Ht 69.0 in | Wt 302.4 lb

## 2023-07-02 DIAGNOSIS — K5904 Chronic idiopathic constipation: Secondary | ICD-10-CM | POA: Insufficient documentation

## 2023-07-02 DIAGNOSIS — Z0001 Encounter for general adult medical examination with abnormal findings: Secondary | ICD-10-CM

## 2023-07-02 DIAGNOSIS — Z125 Encounter for screening for malignant neoplasm of prostate: Secondary | ICD-10-CM

## 2023-07-02 DIAGNOSIS — E559 Vitamin D deficiency, unspecified: Secondary | ICD-10-CM | POA: Diagnosis not present

## 2023-07-02 DIAGNOSIS — I10 Essential (primary) hypertension: Secondary | ICD-10-CM

## 2023-07-02 DIAGNOSIS — E1169 Type 2 diabetes mellitus with other specified complication: Secondary | ICD-10-CM | POA: Diagnosis not present

## 2023-07-02 DIAGNOSIS — E782 Mixed hyperlipidemia: Secondary | ICD-10-CM

## 2023-07-02 DIAGNOSIS — L237 Allergic contact dermatitis due to plants, except food: Secondary | ICD-10-CM

## 2023-07-02 MED ORDER — BENAZEPRIL HCL 40 MG PO TABS
40.0000 mg | ORAL_TABLET | Freq: Every day | ORAL | 3 refills | Status: DC
  Filled 2023-07-02: qty 90, 90d supply, fill #0
  Filled 2023-10-06: qty 90, 90d supply, fill #1
  Filled 2024-01-26: qty 90, 90d supply, fill #2

## 2023-07-02 MED ORDER — AMLODIPINE BESYLATE 10 MG PO TABS
10.0000 mg | ORAL_TABLET | Freq: Every day | ORAL | 3 refills | Status: DC
  Filled 2023-07-02: qty 90, 90d supply, fill #0
  Filled 2023-10-06: qty 90, 90d supply, fill #1
  Filled 2024-01-26: qty 90, 90d supply, fill #2

## 2023-07-02 MED ORDER — CLOBETASOL PROPIONATE 0.05 % EX CREA
1.0000 | TOPICAL_CREAM | Freq: Two times a day (BID) | CUTANEOUS | 1 refills | Status: DC
  Filled 2023-07-02: qty 30, 15d supply, fill #0

## 2023-07-02 MED ORDER — LINACLOTIDE 145 MCG PO CAPS
145.0000 ug | ORAL_CAPSULE | Freq: Every day | ORAL | 3 refills | Status: DC
  Filled 2023-07-02: qty 30, 30d supply, fill #0

## 2023-07-02 MED ORDER — HYDRALAZINE HCL 10 MG PO TABS
10.0000 mg | ORAL_TABLET | Freq: Two times a day (BID) | ORAL | 5 refills | Status: DC
  Filled 2023-07-02: qty 60, 30d supply, fill #0
  Filled 2023-10-06: qty 60, 30d supply, fill #1
  Filled 2024-01-26: qty 60, 30d supply, fill #2
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
## 2023-07-02 NOTE — Assessment & Plan Note (Signed)
BP Readings from Last 1 Encounters:  07/02/23 129/83   Well-controlled with Amlodipine, Benazepril and Hydralazine Takes Lasix 20 mg QOD for leg swelling Counseled for compliance with the medications Advised DASH diet and moderate exercise/walking, at least 150 mins/week

## 2023-07-02 NOTE — Assessment & Plan Note (Signed)
Has tried Colace, senna and MiraLAX without much relief Started Linzess for chronic constipation Advised to maintain adequate hydration and try prune juice

## 2023-07-02 NOTE — Assessment & Plan Note (Signed)
Diet modification and moderate exercise/walking at least 150 mins/week Had weight loss with Ozempic, but gained after stopping it Prefers to try "Feel better system"

## 2023-07-02 NOTE — Assessment & Plan Note (Signed)

## 2023-07-02 NOTE — Assessment & Plan Note (Signed)
Has intermittent poison ivy dermatitis Clobetasol cream as needed for itching Would avoid oral steroids due to history of DM Advised to try Zyrtec, he states that he feels drowsy with Benadryl

## 2023-07-02 NOTE — Assessment & Plan Note (Signed)
On Pravastatin Check lipid profile 

## 2023-07-02 NOTE — Assessment & Plan Note (Signed)
Ordered PSA after discussing its limitations for prostate cancer screening, including false positive results leading to additional investigations. 

## 2023-07-02 NOTE — Patient Instructions (Addendum)
Please apply Clobetasol cream over rash area. Okay to take Zyrtec for poison ivy dermatitis.  Please start taking Linzess for constipation.  Okay to take Miralax as needed for constipation. Please maintain at least 64 ounces of fluid intake in a day.  Please continue to take other medications as prescribed.  Please continue to follow low carb diet and perform moderate exercise/walking at least 150 mins/week.

## 2023-07-02 NOTE — Assessment & Plan Note (Signed)
Lab Results  Component Value Date   HGBA1C 6.4 (H) 12/25/2022   Associated with HTN and HLD Well-controlled On Metformin Had diarrhea with Mounjaro and Ozempic Advised to follow diabetic diet On statin and ACEi F/u CMP and lipid panel Diabetic eye exam: Advised to follow up with Ophthalmology for diabetic eye exam

## 2023-07-03 ENCOUNTER — Other Ambulatory Visit (HOSPITAL_COMMUNITY): Payer: Self-pay

## 2023-07-04 LAB — CMP14+EGFR
ALT: 30 IU/L (ref 0–44)
AST: 18 IU/L (ref 0–40)
Albumin: 4.2 g/dL (ref 3.8–4.9)
Alkaline Phosphatase: 56 IU/L (ref 44–121)
BUN/Creatinine Ratio: 16 (ref 9–20)
BUN: 19 mg/dL (ref 6–24)
Bilirubin Total: 0.2 mg/dL (ref 0.0–1.2)
CO2: 24 mmol/L (ref 20–29)
Calcium: 9.5 mg/dL (ref 8.7–10.2)
Chloride: 103 mmol/L (ref 96–106)
Creatinine, Ser: 1.21 mg/dL (ref 0.76–1.27)
Globulin, Total: 2.9 g/dL (ref 1.5–4.5)
Glucose: 104 mg/dL — ABNORMAL HIGH (ref 70–99)
Potassium: 4.3 mmol/L (ref 3.5–5.2)
Sodium: 141 mmol/L (ref 134–144)
Total Protein: 7.1 g/dL (ref 6.0–8.5)
eGFR: 71 mL/min/{1.73_m2} (ref >59)

## 2023-07-04 LAB — CBC WITH DIFFERENTIAL/PLATELET
Basophils Absolute: 0.1 10*3/uL (ref 0.0–0.2)
Basos: 1 %
EOS (ABSOLUTE): 0.2 10*3/uL (ref 0.0–0.4)
Eos: 3 %
Hematocrit: 40.1 % (ref 37.5–51.0)
Hemoglobin: 13.2 g/dL (ref 13.0–17.7)
Immature Grans (Abs): 0 10*3/uL (ref 0.0–0.1)
Immature Granulocytes: 0 %
Lymphocytes Absolute: 4.4 10*3/uL — ABNORMAL HIGH (ref 0.7–3.1)
Lymphs: 66 %
MCH: 29.1 pg (ref 26.6–33.0)
MCHC: 32.9 g/dL (ref 31.5–35.7)
MCV: 89 fL (ref 79–97)
Monocytes Absolute: 0.7 10*3/uL (ref 0.1–0.9)
Monocytes: 10 %
Neutrophils Absolute: 1.3 10*3/uL — ABNORMAL LOW (ref 1.4–7.0)
Neutrophils: 20 %
Platelets: 305 10*3/uL (ref 150–450)
RBC: 4.53 x10E6/uL (ref 4.14–5.80)
RDW: 13.8 % (ref 11.6–15.4)
WBC: 6.7 10*3/uL (ref 3.4–10.8)

## 2023-07-04 LAB — TSH: TSH: 1.76 u[IU]/mL (ref 0.450–4.500)

## 2023-07-04 LAB — LIPID PANEL
Chol/HDL Ratio: 3.9 ratio (ref 0.0–5.0)
Cholesterol, Total: 139 mg/dL (ref 100–199)
HDL: 36 mg/dL — ABNORMAL LOW (ref >39)
LDL Chol Calc (NIH): 86 mg/dL (ref 0–99)
Triglycerides: 89 mg/dL (ref 0–149)
VLDL Cholesterol Cal: 17 mg/dL (ref 5–40)

## 2023-07-04 LAB — HEMOGLOBIN A1C
Est. average glucose Bld gHb Est-mCnc: 140 mg/dL
Hgb A1c MFr Bld: 6.5 % — ABNORMAL HIGH (ref 4.8–5.6)

## 2023-07-04 LAB — MICROALBUMIN / CREATININE URINE RATIO
Creatinine, Urine: 244 mg/dL
Microalb/Creat Ratio: 3 mg/g creat (ref 0–29)
Microalbumin, Urine: 8.1 ug/mL

## 2023-07-04 LAB — VITAMIN D 25 HYDROXY (VIT D DEFICIENCY, FRACTURES): Vit D, 25-Hydroxy: 23.2 ng/mL — ABNORMAL LOW (ref 30.0–100.0)

## 2023-07-04 LAB — PSA: Prostate Specific Ag, Serum: 3.2 ng/mL (ref 0.0–4.0)

## 2023-07-07 ENCOUNTER — Other Ambulatory Visit (HOSPITAL_COMMUNITY): Payer: Self-pay

## 2023-07-10 DIAGNOSIS — M79671 Pain in right foot: Secondary | ICD-10-CM | POA: Diagnosis not present

## 2023-07-18 DIAGNOSIS — M79671 Pain in right foot: Secondary | ICD-10-CM | POA: Diagnosis not present

## 2023-07-22 ENCOUNTER — Other Ambulatory Visit (HOSPITAL_COMMUNITY): Payer: Self-pay

## 2023-07-23 ENCOUNTER — Other Ambulatory Visit (HOSPITAL_COMMUNITY): Payer: Self-pay

## 2023-07-31 ENCOUNTER — Other Ambulatory Visit (HOSPITAL_COMMUNITY): Payer: Self-pay

## 2023-07-31 DIAGNOSIS — M722 Plantar fascial fibromatosis: Secondary | ICD-10-CM | POA: Diagnosis not present

## 2023-08-18 ENCOUNTER — Other Ambulatory Visit (HOSPITAL_COMMUNITY): Payer: Self-pay

## 2023-10-05 IMAGING — CT CT ABD-PELV W/ CM
2 of 5 series · 16 of 46 positions shown, 18 images · IV contrast (Omnipaque or Isovue)
Comparison: None.

CLINICAL DATA: Acute nonlocalized abdomen pain with nausea and
vomiting for 5 days.

EXAM:
CT ABDOMEN AND PELVIS WITH CONTRAST
TECHNIQUE: Multidetector CT imaging of the abdomen and pelvis was performed
using the standard protocol following bolus administration of
intravenous contrast.

[Series 2: axial st · axial · 0.98mm/px · z∈[+1393,+1853]mm · 13 of 104 slices shown, 15 images]
[im 6/104  soft-tissue]
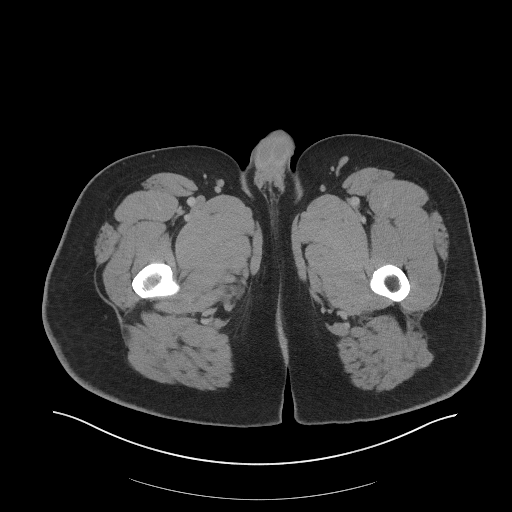
[im 6/104  bone]
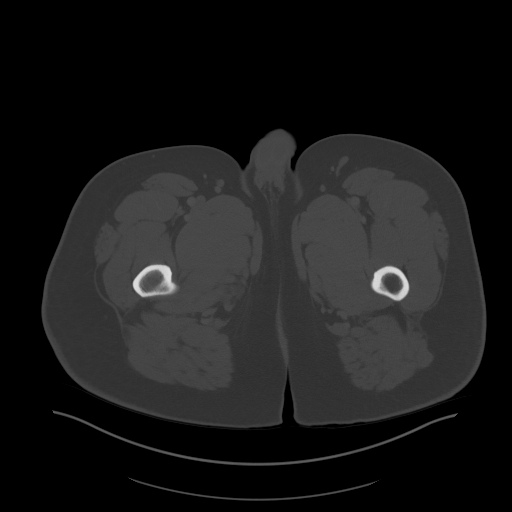
[im 17/104  soft-tissue]
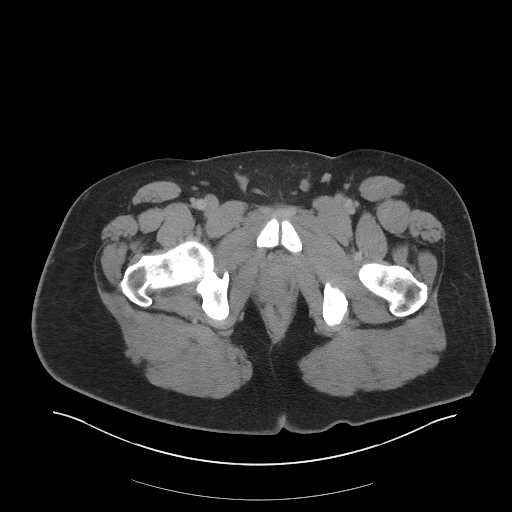
[im 22/104  soft-tissue]
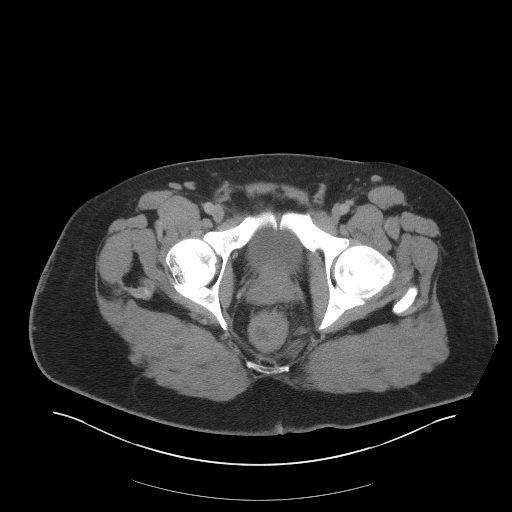
[im 28/104  soft-tissue]
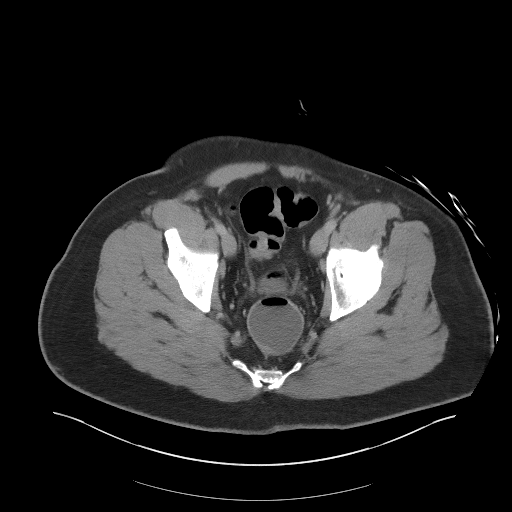
[im 38/104  soft-tissue]
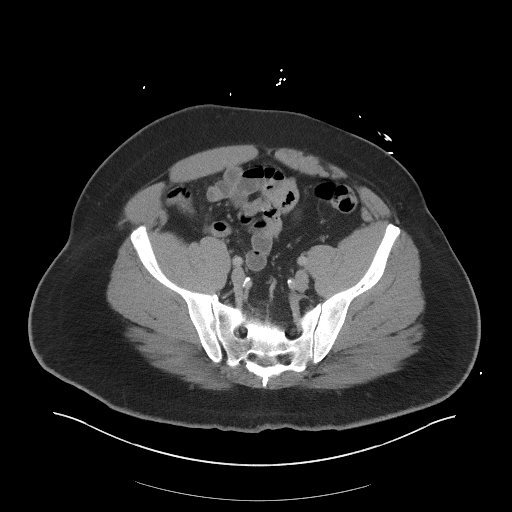
[im 44/104  soft-tissue]
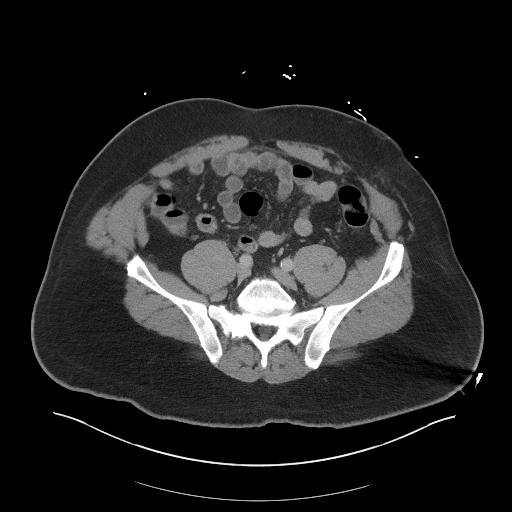
[im 55/104  soft-tissue]
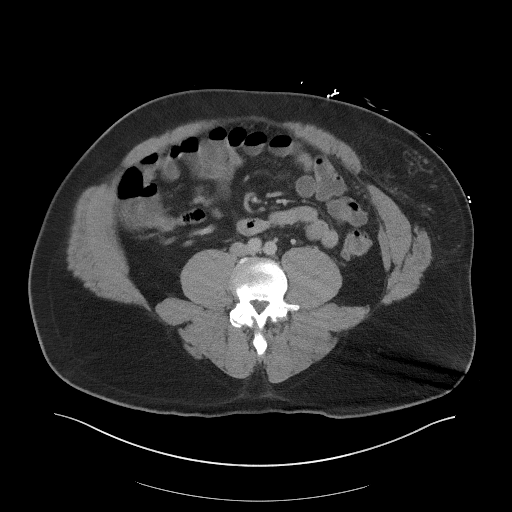
[im 60/104  soft-tissue]
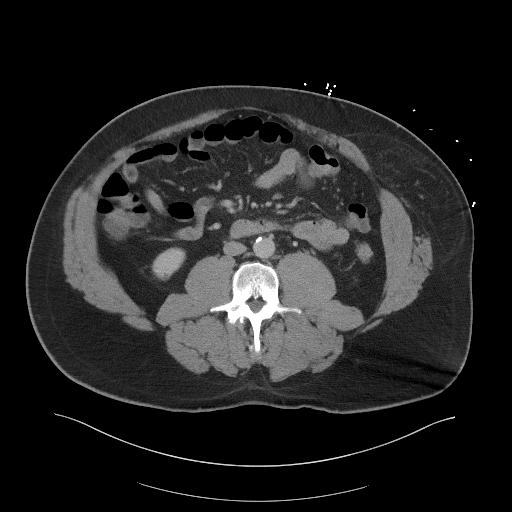
[im 66/104  soft-tissue]
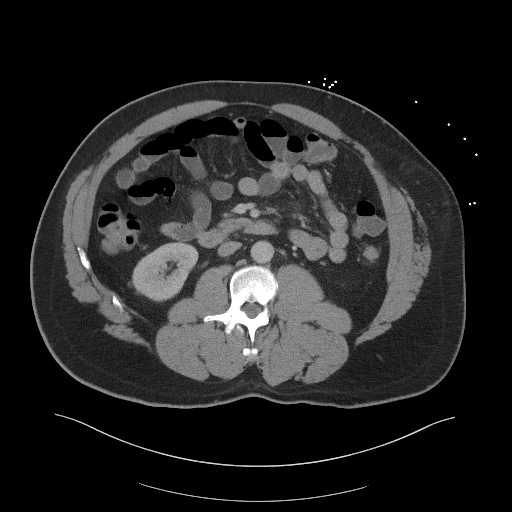
[im 66/104  bone]
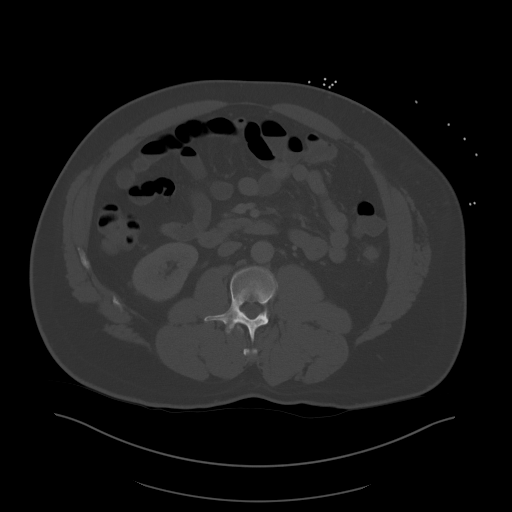
[im 76/104  soft-tissue]
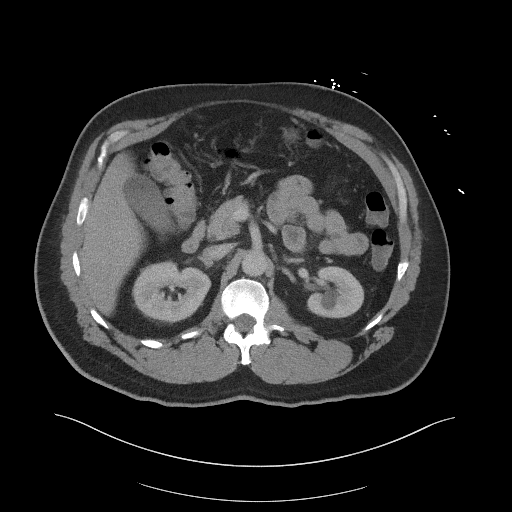
[im 82/104  soft-tissue]
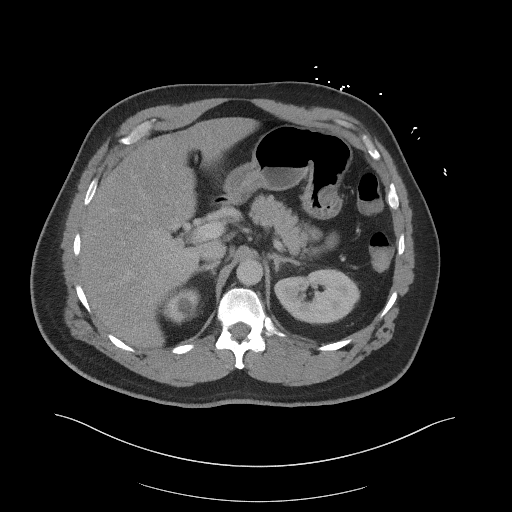
[im 87/104  soft-tissue]
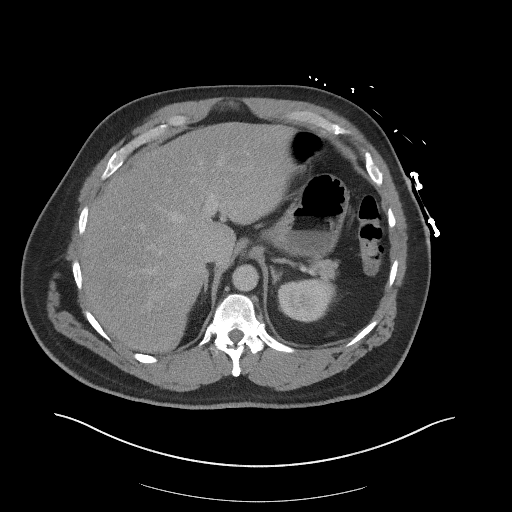
[im 98/104  soft-tissue]
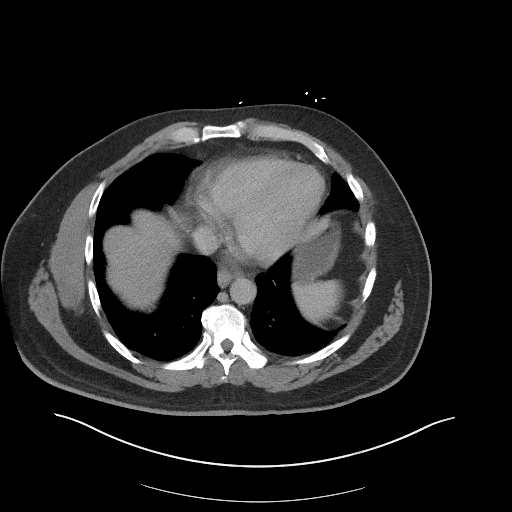

[Series 5: coronal st · coronal · 0.90mm/px · 3 of 123 slices shown]
[im 41/123  soft-tissue]
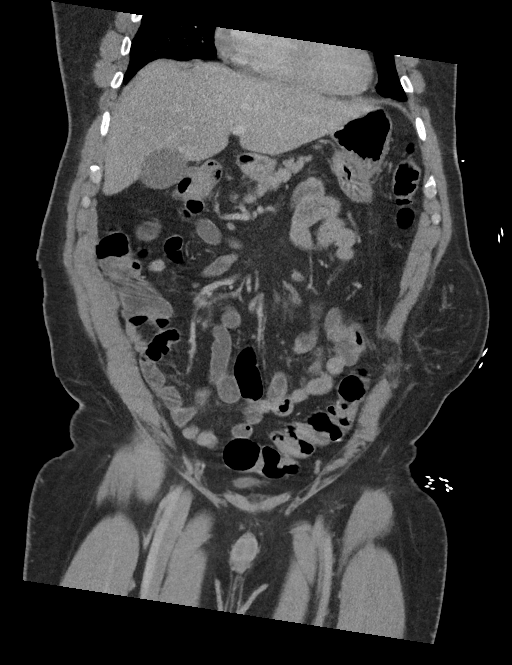
[im 55/123  soft-tissue]
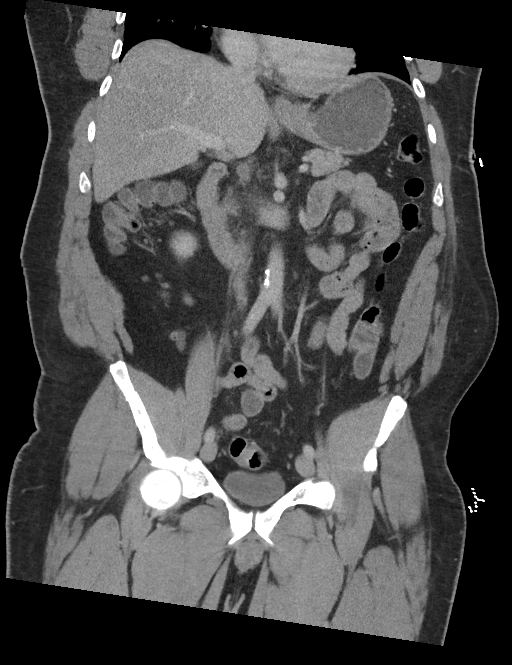
[im 68/123  soft-tissue]
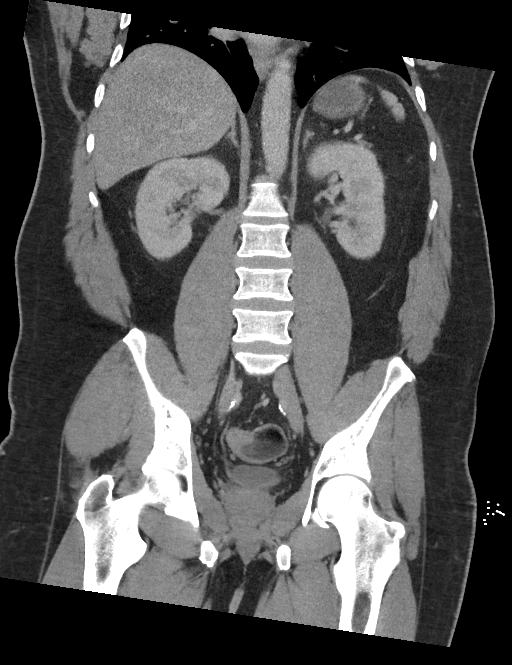

[16 of 46 positions shown; findings below may reference images not displayed]

RADIATION DOSE REDUCTION: This exam was performed according to the
departmental dose-optimization program which includes automated
exposure control, adjustment of the mA and/or kV according to
patient size and/or use of iterative reconstruction technique.

CONTRAST:  100mL OMNIPAQUE IOHEXOL 300 MG/ML  SOLN
FINDINGS: Lower chest: Minimal dependent atelectasis of the posterior lung
bases are identified. Heart size is normal.

Hepatobiliary: Diffuse low density of the liver is identified
without focal vessel displacement. No focal liver mass is noted. The
gallbladder and biliary tree are normal.

Pancreas: Unremarkable. No pancreatic ductal dilatation or
surrounding inflammatory changes.

Spleen: Normal in size without focal abnormality.

Adrenals/Urinary Tract: Bilateral adrenal glands are normal. Simple
cysts are identified in bilateral kidneys. There is no
hydronephrosis bilaterally. Bladder is normal.

Stomach/Bowel: Stomach is within normal limits. Appendix appears
normal. No evidence of bowel wall thickening, distention, or
inflammatory changes.

Vascular/Lymphatic: Aortic atherosclerosis. No enlarged abdominal or
pelvic lymph nodes.

Reproductive: Prostate is unremarkable.

Other: Small umbilical herniation of mesenteric fat is identified.
There is a left spigelian hernia of mesenteric fat measuring 15.3 x
6 x 10.9 cm.

Musculoskeletal: No acute or significant osseous findings.
IMPRESSION: 1. No acute abnormality identified in the abdomen or pelvis.
2. Left Spigelian hernia of mesenteric fat measuring 15.3 x 6 x
cm.
3. Fatty infiltration of the liver.
4. Bilateral renal cysts.
5. Aortic atherosclerosis.

Aortic Atherosclerosis ([id number]).

## 2023-10-13 ENCOUNTER — Other Ambulatory Visit (HOSPITAL_COMMUNITY): Payer: Self-pay

## 2023-10-13 ENCOUNTER — Encounter: Payer: Self-pay | Admitting: Family Medicine

## 2023-10-13 ENCOUNTER — Ambulatory Visit: Payer: Commercial Managed Care - PPO | Admitting: Family Medicine

## 2023-10-13 VITALS — BP 129/72 | HR 91 | Ht 69.0 in | Wt 298.2 lb

## 2023-10-13 DIAGNOSIS — J069 Acute upper respiratory infection, unspecified: Secondary | ICD-10-CM | POA: Insufficient documentation

## 2023-10-13 MED ORDER — PREDNISONE 20 MG PO TABS
20.0000 mg | ORAL_TABLET | Freq: Two times a day (BID) | ORAL | 0 refills | Status: AC
  Filled 2023-10-13: qty 10, 5d supply, fill #0

## 2023-10-13 MED ORDER — AZITHROMYCIN 250 MG PO TABS
ORAL_TABLET | ORAL | 0 refills | Status: AC
  Filled 2023-10-13: qty 6, 5d supply, fill #0

## 2023-10-13 MED ORDER — BENZONATATE 200 MG PO CAPS
200.0000 mg | ORAL_CAPSULE | Freq: Two times a day (BID) | ORAL | 0 refills | Status: DC | PRN
  Filled 2023-10-13: qty 20, 10d supply, fill #0
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
## 2023-10-13 NOTE — Assessment & Plan Note (Signed)
Azithromycin 250 mg twice daily x 5 days,  Prednisone 20 mg twice day x 5 days , Benzonatate 200 mg PRN,  Advise patient to rest to support your body's recovery. Stay hydrated by drinking water, tea, or broth. Using a humidifier can help soothe throat irritation and ease nasal congestion. For fever or pain, acetaminophen (Tylenol) is recommended. To relieve other symptoms, try saline nasal sprays, throat lozenges, or gargling with saltwater. Focus on eating light, healthy meals like fruits and vegetables to keep your strength up. Practice good hygiene by washing your hands frequently and covering your mouth when coughing or sneezing.Follow-up for worsening or persistent symptoms. Patient verbalizes understanding regarding plan of care and all questions answered

## 2023-10-13 NOTE — Patient Instructions (Addendum)
        Great to see you today.  I have refilled the medication(s) we provide.    - Please take medications as prescribed. - Follow up with your primary health provider if any health concerns arises. - If symptoms worsen please contact your primary care provider and/or visit the emergency department.  

## 2023-10-14 ENCOUNTER — Other Ambulatory Visit (HOSPITAL_COMMUNITY): Payer: Self-pay

## 2023-11-17 ENCOUNTER — Ambulatory Visit (INDEPENDENT_AMBULATORY_CARE_PROVIDER_SITE_OTHER): Payer: Commercial Managed Care - PPO

## 2023-11-17 DIAGNOSIS — Z23 Encounter for immunization: Secondary | ICD-10-CM

## 2023-12-01 ENCOUNTER — Other Ambulatory Visit: Payer: Self-pay | Admitting: Internal Medicine

## 2023-12-01 ENCOUNTER — Other Ambulatory Visit (HOSPITAL_COMMUNITY): Payer: Self-pay

## 2023-12-01 DIAGNOSIS — E1169 Type 2 diabetes mellitus with other specified complication: Secondary | ICD-10-CM

## 2023-12-01 MED ORDER — METFORMIN HCL 500 MG PO TABS
500.0000 mg | ORAL_TABLET | Freq: Every day | ORAL | 1 refills | Status: DC
  Filled 2023-12-01: qty 90, 90d supply, fill #0
  Filled 2024-03-09: qty 90, 90d supply, fill #1

## 2024-01-02 ENCOUNTER — Ambulatory Visit: Payer: Commercial Managed Care - PPO | Admitting: Internal Medicine

## 2024-01-26 ENCOUNTER — Other Ambulatory Visit (HOSPITAL_COMMUNITY): Payer: Self-pay

## 2024-01-26 ENCOUNTER — Other Ambulatory Visit: Payer: Self-pay | Admitting: Internal Medicine

## 2024-01-26 DIAGNOSIS — E1169 Type 2 diabetes mellitus with other specified complication: Secondary | ICD-10-CM

## 2024-01-26 MED ORDER — PRAVASTATIN SODIUM 40 MG PO TABS
40.0000 mg | ORAL_TABLET | Freq: Every day | ORAL | 2 refills | Status: DC
  Filled 2024-01-26: qty 90, 90d supply, fill #0

## 2024-01-29 ENCOUNTER — Other Ambulatory Visit (HOSPITAL_COMMUNITY): Payer: Self-pay

## 2024-01-29 ENCOUNTER — Ambulatory Visit: Payer: Commercial Managed Care - PPO | Admitting: Internal Medicine

## 2024-01-29 ENCOUNTER — Encounter: Payer: Self-pay | Admitting: Internal Medicine

## 2024-01-29 ENCOUNTER — Other Ambulatory Visit: Payer: Self-pay

## 2024-01-29 VITALS — BP 128/74 | HR 81 | Ht 69.0 in | Wt 296.4 lb

## 2024-01-29 DIAGNOSIS — Z23 Encounter for immunization: Secondary | ICD-10-CM | POA: Diagnosis not present

## 2024-01-29 DIAGNOSIS — L237 Allergic contact dermatitis due to plants, except food: Secondary | ICD-10-CM | POA: Diagnosis not present

## 2024-01-29 DIAGNOSIS — E782 Mixed hyperlipidemia: Secondary | ICD-10-CM | POA: Diagnosis not present

## 2024-01-29 DIAGNOSIS — E1169 Type 2 diabetes mellitus with other specified complication: Secondary | ICD-10-CM | POA: Diagnosis not present

## 2024-01-29 DIAGNOSIS — M7989 Other specified soft tissue disorders: Secondary | ICD-10-CM

## 2024-01-29 DIAGNOSIS — M25649 Stiffness of unspecified hand, not elsewhere classified: Secondary | ICD-10-CM | POA: Diagnosis not present

## 2024-01-29 DIAGNOSIS — Z7984 Long term (current) use of oral hypoglycemic drugs: Secondary | ICD-10-CM

## 2024-01-29 DIAGNOSIS — I10 Essential (primary) hypertension: Secondary | ICD-10-CM | POA: Diagnosis not present

## 2024-01-29 DIAGNOSIS — K5904 Chronic idiopathic constipation: Secondary | ICD-10-CM

## 2024-01-29 MED ORDER — AMLODIPINE BESYLATE 10 MG PO TABS
10.0000 mg | ORAL_TABLET | Freq: Every day | ORAL | 3 refills | Status: DC
  Filled 2024-01-29 – 2024-04-21 (×3): qty 90, 90d supply, fill #0

## 2024-01-29 MED ORDER — CLOBETASOL PROPIONATE 0.05 % EX CREA
1.0000 | TOPICAL_CREAM | Freq: Two times a day (BID) | CUTANEOUS | 1 refills | Status: AC
  Filled 2024-01-29: qty 30, 30d supply, fill #0

## 2024-01-29 MED ORDER — PRAVASTATIN SODIUM 40 MG PO TABS
40.0000 mg | ORAL_TABLET | Freq: Every day | ORAL | 3 refills | Status: DC
  Filled 2024-03-17: qty 90, 90d supply, fill #0
  Filled 2024-06-18: qty 90, 90d supply, fill #1

## 2024-01-29 MED ORDER — BENAZEPRIL HCL 40 MG PO TABS
40.0000 mg | ORAL_TABLET | Freq: Every day | ORAL | 3 refills | Status: AC
  Filled 2024-01-29 – 2024-09-03 (×4): qty 90, 90d supply, fill #0
  Filled 2024-11-16: qty 90, 90d supply, fill #1

## 2024-01-29 MED ORDER — LINACLOTIDE 145 MCG PO CAPS
145.0000 ug | ORAL_CAPSULE | Freq: Every day | ORAL | 3 refills | Status: AC
  Filled 2024-01-29: qty 30, 30d supply, fill #0
  Filled 2024-04-21: qty 30, 30d supply, fill #1

## 2024-01-29 MED ORDER — FUROSEMIDE 20 MG PO TABS
20.0000 mg | ORAL_TABLET | Freq: Every day | ORAL | 5 refills | Status: AC
  Filled 2024-01-29: qty 30, 30d supply, fill #0
  Filled 2024-03-17: qty 30, 30d supply, fill #1
  Filled 2024-04-21: qty 30, 30d supply, fill #2
  Filled 2024-05-20: qty 30, 30d supply, fill #3

## 2024-01-29 MED ORDER — HYDRALAZINE HCL 10 MG PO TABS
10.0000 mg | ORAL_TABLET | Freq: Two times a day (BID) | ORAL | 1 refills | Status: DC
  Filled 2024-01-29 – 2024-03-17 (×2): qty 180, 90d supply, fill #0

## 2024-01-29 NOTE — Assessment & Plan Note (Signed)
 Lab Results  Component Value Date   HGBA1C 6.5 (H) 07/02/2023   Associated with HTN and HLD Well-controlled On Metformin  Had diarrhea with Mounjaro  and Ozempic  Advised to follow diabetic diet On statin and ACEi F/u CMP and lipid panel Diabetic eye exam: Advised to follow up with Ophthalmology for diabetic eye exam

## 2024-01-29 NOTE — Assessment & Plan Note (Signed)
Has intermittent poison ivy dermatitis Clobetasol cream as needed for itching Would avoid oral steroids due to history of DM Advised to try Zyrtec, he states that he feels drowsy with Benadryl

## 2024-01-29 NOTE — Assessment & Plan Note (Signed)
 BMI Readings from Last 3 Encounters:  01/29/24 43.77 kg/m  10/13/23 44.03 kg/m  07/02/23 44.66 kg/m   Diet modification and moderate exercise/walking at least 150 mins/week Had weight loss with Ozempic , but gained after stopping it Prefers to try Feel better system

## 2024-01-29 NOTE — Assessment & Plan Note (Addendum)
On Pravastatin Checked lipid profile

## 2024-01-29 NOTE — Assessment & Plan Note (Signed)
Chronic leg swelling, likely due to prolonged standing Leg elevation as tolerated Lasix every other day as needed Follow low-salt diet

## 2024-01-29 NOTE — Assessment & Plan Note (Signed)
 

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
## 2024-01-29 NOTE — Assessment & Plan Note (Signed)
 Has tried Colace, senna and MiraLAX without much relief On Linzess  for chronic constipation - now improved Advised to maintain adequate hydration and try prune juice

## 2024-01-29 NOTE — Assessment & Plan Note (Signed)
 BP Readings from Last 1 Encounters:  01/29/24 138/78   Well-controlled with Amlodipine , Benazepril  and Hydralazine  Takes Lasix  20 mg QOD for leg swelling Counseled for compliance with the medications Advised DASH diet and moderate exercise/walking, at least 150 mins/week

## 2024-01-29 NOTE — Patient Instructions (Signed)
 Please continue to take medications as prescribed.  Please continue to follow low carb diet and perform moderate exercise/walking at least 150 mins/week.  Please perform hand exercises as shown in material.

## 2024-01-30 LAB — CMP14+EGFR
ALT: 19 [IU]/L (ref 0–44)
AST: 15 [IU]/L (ref 0–40)
Albumin: 4.2 g/dL (ref 3.8–4.9)
Alkaline Phosphatase: 54 [IU]/L (ref 44–121)
BUN/Creatinine Ratio: 15 (ref 9–20)
BUN: 18 mg/dL (ref 6–24)
Bilirubin Total: 0.3 mg/dL (ref 0.0–1.2)
CO2: 23 mmol/L (ref 20–29)
Calcium: 9.7 mg/dL (ref 8.7–10.2)
Chloride: 106 mmol/L (ref 96–106)
Creatinine, Ser: 1.22 mg/dL (ref 0.76–1.27)
Globulin, Total: 2.8 g/dL (ref 1.5–4.5)
Glucose: 110 mg/dL — ABNORMAL HIGH (ref 70–99)
Potassium: 4.4 mmol/L (ref 3.5–5.2)
Sodium: 142 mmol/L (ref 134–144)
Total Protein: 7 g/dL (ref 6.0–8.5)
eGFR: 70 mL/min/{1.73_m2} (ref >59)

## 2024-01-30 LAB — HEMOGLOBIN A1C
Est. average glucose Bld gHb Est-mCnc: 134 mg/dL
Hgb A1c MFr Bld: 6.3 % — ABNORMAL HIGH (ref 4.8–5.6)

## 2024-03-09 ENCOUNTER — Other Ambulatory Visit (HOSPITAL_COMMUNITY): Payer: Self-pay

## 2024-03-12 ENCOUNTER — Encounter: Payer: Self-pay | Admitting: Internal Medicine

## 2024-03-17 ENCOUNTER — Other Ambulatory Visit (HOSPITAL_COMMUNITY): Payer: Self-pay

## 2024-03-17 ENCOUNTER — Other Ambulatory Visit: Payer: Self-pay

## 2024-04-21 ENCOUNTER — Other Ambulatory Visit (HOSPITAL_COMMUNITY): Payer: Self-pay

## 2024-05-20 ENCOUNTER — Other Ambulatory Visit: Payer: Self-pay | Admitting: Internal Medicine

## 2024-05-20 ENCOUNTER — Other Ambulatory Visit (HOSPITAL_COMMUNITY): Payer: Self-pay

## 2024-05-20 DIAGNOSIS — E1169 Type 2 diabetes mellitus with other specified complication: Secondary | ICD-10-CM

## 2024-05-20 MED ORDER — METFORMIN HCL 500 MG PO TABS
500.0000 mg | ORAL_TABLET | Freq: Every day | ORAL | 1 refills | Status: DC
  Filled 2024-05-20: qty 90, 90d supply, fill #0

## 2024-06-15 ENCOUNTER — Other Ambulatory Visit (HOSPITAL_COMMUNITY): Payer: Self-pay

## 2024-06-15 ENCOUNTER — Ambulatory Visit: Payer: Self-pay

## 2024-06-15 VITALS — BP 119/72 | HR 80 | Ht 69.0 in | Wt 295.0 lb

## 2024-06-15 DIAGNOSIS — M545 Low back pain, unspecified: Secondary | ICD-10-CM | POA: Diagnosis not present

## 2024-06-15 DIAGNOSIS — R109 Unspecified abdominal pain: Secondary | ICD-10-CM | POA: Diagnosis not present

## 2024-06-15 MED ORDER — METHOCARBAMOL 750 MG PO TABS
750.0000 mg | ORAL_TABLET | Freq: Three times a day (TID) | ORAL | 2 refills | Status: AC | PRN
  Filled 2024-06-15: qty 30, 10d supply, fill #0

## 2024-06-15 NOTE — Progress Notes (Unsigned)
   Established Patient Office Visit  Subjective   Patient ID: James Murphy, male    DOB: 1966-05-16  Age: 58 y.o. MRN: [id number]  Chief Complaint  Patient presents with   Medical Management of Chronic Issues    Pt states Pain in left sides and raidiates down in him, moving a lot of pain after sitting for a long time, or after getting up    HPI Back Pain  He reports new onset back pain. There was not an injury that may have caused the pain. The most recent episode started a few weeks ago and is staying constant. The pain is located in the left lumbar area{radiating to:11559}. It is described as {quality:31200}, is {severity:119268} in intensity, occurring {frequency of symptoms:119294}. {Symptoms are worse in the:16448:::1}  {Aggravating factors:16449:::1} {Relieving factors:16449:::1}.  He has tried {treatments tried:173219}{improvement:119303}.   Associated symptoms: {Yes/No:20286} abdominal pain {Yes/No:20286} bowel incontinence  {Yes/No:20286} chest pain {Yes/No:20286} dysuria   {Yes/No:20286} fever {Yes/No:20286} headaches  {Yes/No:20286} joint pains {Yes/No:20286} pelvic pain  {Yes/No:20286} weakness in leg  {Yes/No:20286} tingling in lower extremities  {Yes/No:20286} urinary incontinence {Yes/No:20286} weight loss    ----------------------------------------------------------------------------------------- {History (Optional):23778}  ROS    Objective:     BP 119/72   Pulse 80   Ht 5' 9 (1.753 m)   Wt 295 lb (133.8 kg)   SpO2 95%   BMI 43.56 kg/m  {Vitals History (Optional):23777}  Physical Exam   No results found for any visits on 06/15/24.  {Labs (Optional):23779}  The 10-year ASCVD risk score (Arnett DK, et al., 2019) is: 17.4%    Assessment & Plan:   Problem List Items Addressed This Visit   None   No follow-ups on file.    Leita Longs, FNP

## 2024-06-16 LAB — URINALYSIS
Bilirubin, UA: NEGATIVE
Glucose, UA: NEGATIVE
Leukocytes,UA: NEGATIVE
Nitrite, UA: NEGATIVE
Protein,UA: NEGATIVE
RBC, UA: NEGATIVE
Specific Gravity, UA: 1.025 (ref 1.005–1.030)
Urobilinogen, Ur: 0.2 mg/dL (ref 0.2–1.0)
pH, UA: 5.5 (ref 5.0–7.5)

## 2024-06-18 ENCOUNTER — Other Ambulatory Visit (HOSPITAL_COMMUNITY): Payer: Self-pay

## 2024-06-22 DIAGNOSIS — H52223 Regular astigmatism, bilateral: Secondary | ICD-10-CM | POA: Diagnosis not present

## 2024-06-22 DIAGNOSIS — H5203 Hypermetropia, bilateral: Secondary | ICD-10-CM | POA: Diagnosis not present

## 2024-06-22 DIAGNOSIS — H524 Presbyopia: Secondary | ICD-10-CM | POA: Diagnosis not present

## 2024-07-05 ENCOUNTER — Other Ambulatory Visit (HOSPITAL_BASED_OUTPATIENT_CLINIC_OR_DEPARTMENT_OTHER): Payer: Self-pay

## 2024-07-29 ENCOUNTER — Ambulatory Visit (INDEPENDENT_AMBULATORY_CARE_PROVIDER_SITE_OTHER): Payer: Commercial Managed Care - PPO | Admitting: Internal Medicine

## 2024-07-29 ENCOUNTER — Other Ambulatory Visit (HOSPITAL_BASED_OUTPATIENT_CLINIC_OR_DEPARTMENT_OTHER): Payer: Self-pay

## 2024-07-29 ENCOUNTER — Encounter: Payer: Self-pay | Admitting: Internal Medicine

## 2024-07-29 VITALS — BP 129/78 | HR 81 | Ht 69.0 in | Wt 302.8 lb

## 2024-07-29 DIAGNOSIS — G8929 Other chronic pain: Secondary | ICD-10-CM

## 2024-07-29 DIAGNOSIS — Z125 Encounter for screening for malignant neoplasm of prostate: Secondary | ICD-10-CM | POA: Diagnosis not present

## 2024-07-29 DIAGNOSIS — I10 Essential (primary) hypertension: Secondary | ICD-10-CM | POA: Diagnosis not present

## 2024-07-29 DIAGNOSIS — K5904 Chronic idiopathic constipation: Secondary | ICD-10-CM | POA: Diagnosis not present

## 2024-07-29 DIAGNOSIS — E559 Vitamin D deficiency, unspecified: Secondary | ICD-10-CM

## 2024-07-29 DIAGNOSIS — E782 Mixed hyperlipidemia: Secondary | ICD-10-CM

## 2024-07-29 DIAGNOSIS — E1169 Type 2 diabetes mellitus with other specified complication: Secondary | ICD-10-CM

## 2024-07-29 DIAGNOSIS — M5440 Lumbago with sciatica, unspecified side: Secondary | ICD-10-CM

## 2024-07-29 DIAGNOSIS — Z0001 Encounter for general adult medical examination with abnormal findings: Secondary | ICD-10-CM | POA: Diagnosis not present

## 2024-07-29 DIAGNOSIS — Z23 Encounter for immunization: Secondary | ICD-10-CM | POA: Diagnosis not present

## 2024-07-29 MED ORDER — METFORMIN HCL 500 MG PO TABS
500.0000 mg | ORAL_TABLET | Freq: Every day | ORAL | 3 refills | Status: AC
  Filled 2024-07-29 – 2024-09-03 (×3): qty 90, 90d supply, fill #0
  Filled 2024-12-08: qty 90, 90d supply, fill #1

## 2024-07-29 MED ORDER — PRAVASTATIN SODIUM 40 MG PO TABS
40.0000 mg | ORAL_TABLET | Freq: Every day | ORAL | 3 refills | Status: AC
  Filled 2024-07-29 – 2024-09-03 (×2): qty 90, 90d supply, fill #0
  Filled 2024-12-06: qty 90, 90d supply, fill #1

## 2024-07-29 MED ORDER — AMLODIPINE BESYLATE 10 MG PO TABS
10.0000 mg | ORAL_TABLET | Freq: Every day | ORAL | 3 refills | Status: AC
Start: 2024-07-29 — End: ?
  Filled 2024-07-29: qty 90, 90d supply, fill #0
  Filled 2024-11-16: qty 90, 90d supply, fill #1

## 2024-07-29 MED ORDER — HYDRALAZINE HCL 10 MG PO TABS
10.0000 mg | ORAL_TABLET | Freq: Two times a day (BID) | ORAL | 3 refills | Status: AC
  Filled 2024-07-29: qty 180, 90d supply, fill #0

## 2024-07-29 NOTE — Assessment & Plan Note (Signed)
 Lab Results  Component Value Date   HGBA1C 6.3 (H) 01/29/2024   Associated with HTN and HLD Well-controlled On Metformin  500 mg QD Had diarrhea with Mounjaro  and Ozempic  Advised to follow diabetic diet On statin and ACEi F/u CMP and lipid panel Diabetic eye exam: Advised to follow up with Ophthalmology for diabetic eye exam

## 2024-07-29 NOTE — Assessment & Plan Note (Signed)
 Ordered PSA after discussing its limitations for prostate cancer screening, including false positive results leading to additional investigations.

## 2024-07-29 NOTE — Assessment & Plan Note (Signed)
 Has tried Colace, senna and MiraLAX without much relief On Linzess  for chronic constipation - now improved Advised to maintain adequate hydration and try prune juice
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
## 2024-07-29 NOTE — Assessment & Plan Note (Addendum)
 BP Readings from Last 1 Encounters:  07/29/24 129/78   Well-controlled with Amlodipine  10 mg QD, Benazepril  40 mg QD and Hydralazine  10 mg BID Takes Lasix  20 mg QOD for leg swelling Counseled for compliance with the medications Advised DASH diet and moderate exercise/walking, at least 150 mins/week

## 2024-07-29 NOTE — Patient Instructions (Signed)
 Please continue to take medications as prescribed.  Please continue to follow low carb diet and perform moderate exercise/walking at least 150 mins/week.

## 2024-07-29 NOTE — Assessment & Plan Note (Signed)

## 2024-07-29 NOTE — Assessment & Plan Note (Signed)
 Chronic, intermittent low back pain Advised to avoid heavy lifting and frequent bending Tylenol  arthritis as needed for pain If persistent or worsening, will get imaging Simple back exercises material provided

## 2024-07-30 ENCOUNTER — Ambulatory Visit: Payer: Self-pay | Admitting: Internal Medicine

## 2024-07-30 LAB — PSA: Prostate Specific Ag, Serum: 3 ng/mL (ref 0.0–4.0)

## 2024-07-30 LAB — CMP14+EGFR
ALT: 22 IU/L (ref 0–44)
AST: 20 IU/L (ref 0–40)
Albumin: 4.3 g/dL (ref 3.8–4.9)
Alkaline Phosphatase: 55 IU/L (ref 44–121)
BUN/Creatinine Ratio: 13 (ref 9–20)
BUN: 15 mg/dL (ref 6–24)
Bilirubin Total: 0.3 mg/dL (ref 0.0–1.2)
CO2: 21 mmol/L (ref 20–29)
Calcium: 9.4 mg/dL (ref 8.7–10.2)
Chloride: 105 mmol/L (ref 96–106)
Creatinine, Ser: 1.2 mg/dL (ref 0.76–1.27)
Globulin, Total: 2.7 g/dL (ref 1.5–4.5)
Glucose: 111 mg/dL — ABNORMAL HIGH (ref 70–99)
Potassium: 4.6 mmol/L (ref 3.5–5.2)
Sodium: 141 mmol/L (ref 134–144)
Total Protein: 7 g/dL (ref 6.0–8.5)
eGFR: 71 mL/min/1.73 (ref >59)

## 2024-07-30 LAB — CBC WITH DIFFERENTIAL/PLATELET
Basophils Absolute: 0.1 x10E3/uL (ref 0.0–0.2)
Basos: 1 %
EOS (ABSOLUTE): 0.3 x10E3/uL (ref 0.0–0.4)
Eos: 4 %
Hematocrit: 41.5 % (ref 37.5–51.0)
Hemoglobin: 13 g/dL (ref 13.0–17.7)
Immature Grans (Abs): 0 x10E3/uL (ref 0.0–0.1)
Immature Granulocytes: 0 %
Lymphocytes Absolute: 4.1 x10E3/uL — ABNORMAL HIGH (ref 0.7–3.1)
Lymphs: 58 %
MCH: 29 pg (ref 26.6–33.0)
MCHC: 31.3 g/dL — ABNORMAL LOW (ref 31.5–35.7)
MCV: 92 fL (ref 79–97)
Monocytes Absolute: 0.6 x10E3/uL (ref 0.1–0.9)
Monocytes: 8 %
Neutrophils Absolute: 2 x10E3/uL (ref 1.4–7.0)
Neutrophils: 29 %
Platelets: 343 x10E3/uL (ref 150–450)
RBC: 4.49 x10E6/uL (ref 4.14–5.80)
RDW: 14 % (ref 11.6–15.4)
WBC: 7.1 x10E3/uL (ref 3.4–10.8)

## 2024-07-30 LAB — HEMOGLOBIN A1C
Est. average glucose Bld gHb Est-mCnc: 134 mg/dL
Hgb A1c MFr Bld: 6.3 % — ABNORMAL HIGH (ref 4.8–5.6)

## 2024-07-30 LAB — TSH: TSH: 1.38 u[IU]/mL (ref 0.450–4.500)

## 2024-07-30 LAB — LIPID PANEL
Chol/HDL Ratio: 3.4 ratio (ref 0.0–5.0)
Cholesterol, Total: 128 mg/dL (ref 100–199)
HDL: 38 mg/dL — ABNORMAL LOW (ref >39)
LDL Chol Calc (NIH): 77 mg/dL (ref 0–99)
Triglycerides: 63 mg/dL (ref 0–149)
VLDL Cholesterol Cal: 13 mg/dL (ref 5–40)

## 2024-07-30 LAB — VITAMIN D 25 HYDROXY (VIT D DEFICIENCY, FRACTURES): Vit D, 25-Hydroxy: 29.8 ng/mL — ABNORMAL LOW (ref 30.0–100.0)

## 2024-07-31 LAB — MICROALBUMIN / CREATININE URINE RATIO
Creatinine, Urine: 151.3 mg/dL
Microalb/Creat Ratio: 2 mg/g{creat} (ref 0–29)
Microalbumin, Urine: 3.2 ug/mL

## 2024-08-02 ENCOUNTER — Other Ambulatory Visit (HOSPITAL_BASED_OUTPATIENT_CLINIC_OR_DEPARTMENT_OTHER): Payer: Self-pay

## 2024-08-03 ENCOUNTER — Other Ambulatory Visit (HOSPITAL_BASED_OUTPATIENT_CLINIC_OR_DEPARTMENT_OTHER): Payer: Self-pay

## 2024-08-06 ENCOUNTER — Other Ambulatory Visit (HOSPITAL_BASED_OUTPATIENT_CLINIC_OR_DEPARTMENT_OTHER): Payer: Self-pay

## 2024-08-11 ENCOUNTER — Other Ambulatory Visit (HOSPITAL_BASED_OUTPATIENT_CLINIC_OR_DEPARTMENT_OTHER): Payer: Self-pay

## 2024-08-12 ENCOUNTER — Other Ambulatory Visit (HOSPITAL_BASED_OUTPATIENT_CLINIC_OR_DEPARTMENT_OTHER): Payer: Self-pay

## 2024-09-01 ENCOUNTER — Other Ambulatory Visit (HOSPITAL_BASED_OUTPATIENT_CLINIC_OR_DEPARTMENT_OTHER): Payer: Self-pay

## 2024-09-03 ENCOUNTER — Other Ambulatory Visit: Payer: Self-pay

## 2024-09-03 ENCOUNTER — Other Ambulatory Visit (HOSPITAL_BASED_OUTPATIENT_CLINIC_OR_DEPARTMENT_OTHER): Payer: Self-pay

## 2024-11-01 ENCOUNTER — Telehealth: Payer: Self-pay | Admitting: Internal Medicine

## 2024-11-01 NOTE — Telephone Encounter (Signed)
 Numerous attempts to contact patient with recall letters. Unable to reach by telephone. with no success.   4 attempts to schedule the appointment.

## 2024-11-17 ENCOUNTER — Other Ambulatory Visit (HOSPITAL_BASED_OUTPATIENT_CLINIC_OR_DEPARTMENT_OTHER): Payer: Self-pay

## 2024-12-06 ENCOUNTER — Other Ambulatory Visit (HOSPITAL_BASED_OUTPATIENT_CLINIC_OR_DEPARTMENT_OTHER): Payer: Self-pay

## 2024-12-08 ENCOUNTER — Other Ambulatory Visit (HOSPITAL_BASED_OUTPATIENT_CLINIC_OR_DEPARTMENT_OTHER): Payer: Self-pay

## 2024-12-08 MED ORDER — FLUBLOK 0.5 ML IM SOSY
0.5000 mL | PREFILLED_SYRINGE | Freq: Once | INTRAMUSCULAR | 0 refills | Status: AC
  Filled 2024-12-08: qty 0.5, 1d supply, fill #0

## 2025-02-02 ENCOUNTER — Ambulatory Visit: Admitting: Internal Medicine
